# Patient Record
Sex: Male | Born: 1965 | Race: Black or African American | Hispanic: No | Smoking: Current every day smoker
Health system: Southern US, Community
[De-identification: ages and names within clinical notes are randomized; demographics above are authoritative.]

## PROBLEM LIST (undated history)

## (undated) DIAGNOSIS — R45851 Suicidal ideations: Secondary | ICD-10-CM

## (undated) DIAGNOSIS — I10 Essential (primary) hypertension: Secondary | ICD-10-CM

## (undated) DIAGNOSIS — F1911 Other psychoactive substance abuse, in remission: Secondary | ICD-10-CM

## (undated) DIAGNOSIS — F4310 Post-traumatic stress disorder, unspecified: Secondary | ICD-10-CM

## (undated) DIAGNOSIS — M549 Dorsalgia, unspecified: Secondary | ICD-10-CM

## (undated) DIAGNOSIS — R001 Bradycardia, unspecified: Secondary | ICD-10-CM

## (undated) DIAGNOSIS — R110 Nausea: Secondary | ICD-10-CM

## (undated) DIAGNOSIS — R51 Headache: Secondary | ICD-10-CM

---

## 2003-01-08 ENCOUNTER — Emergency Department (HOSPITAL_COMMUNITY): Admission: EM | Admit: 2003-01-08 | Discharge: 2003-01-08 | Payer: Self-pay | Admitting: Emergency Medicine

## 2003-01-12 ENCOUNTER — Emergency Department (HOSPITAL_COMMUNITY): Admission: EM | Admit: 2003-01-12 | Discharge: 2003-01-12 | Payer: Self-pay | Admitting: Emergency Medicine

## 2005-12-30 ENCOUNTER — Emergency Department (HOSPITAL_COMMUNITY): Admission: EM | Admit: 2005-12-30 | Discharge: 2005-12-30 | Payer: Self-pay | Admitting: Emergency Medicine

## 2011-10-13 ENCOUNTER — Encounter (HOSPITAL_COMMUNITY): Payer: Self-pay | Admitting: Emergency Medicine

## 2011-10-13 ENCOUNTER — Other Ambulatory Visit: Payer: Self-pay

## 2011-10-13 ENCOUNTER — Emergency Department (INDEPENDENT_AMBULATORY_CARE_PROVIDER_SITE_OTHER)
Admission: EM | Admit: 2011-10-13 | Discharge: 2011-10-13 | Disposition: A | Discharge status: Nursing Home (Includes ICF) | Payer: Self-pay | Source: Home / Self Care | Attending: Family Medicine | Admitting: Family Medicine

## 2011-10-13 ENCOUNTER — Emergency Department (HOSPITAL_COMMUNITY): Payer: Self-pay

## 2011-10-13 ENCOUNTER — Encounter: Payer: Self-pay | Admitting: Emergency Medicine

## 2011-10-13 ENCOUNTER — Inpatient Hospital Stay (HOSPITAL_COMMUNITY)
Admission: EM | Admit: 2011-10-13 | Discharge: 2011-10-14 | DRG: 305 | Disposition: A | Discharge status: Home / Self Care | Payer: Self-pay | Attending: Internal Medicine | Admitting: Internal Medicine

## 2011-10-13 DIAGNOSIS — Z8659 Personal history of other mental and behavioral disorders: Secondary | ICD-10-CM

## 2011-10-13 DIAGNOSIS — Z720 Tobacco use: Secondary | ICD-10-CM

## 2011-10-13 DIAGNOSIS — F17200 Nicotine dependence, unspecified, uncomplicated: Secondary | ICD-10-CM | POA: Diagnosis present

## 2011-10-13 DIAGNOSIS — I10 Essential (primary) hypertension: Secondary | ICD-10-CM

## 2011-10-13 DIAGNOSIS — Z79899 Other long term (current) drug therapy: Secondary | ICD-10-CM

## 2011-10-13 DIAGNOSIS — Z91199 Patient's noncompliance with other medical treatment and regimen due to unspecified reason: Secondary | ICD-10-CM

## 2011-10-13 DIAGNOSIS — Z9114 Patient's other noncompliance with medication regimen: Secondary | ICD-10-CM

## 2011-10-13 DIAGNOSIS — I169 Hypertensive crisis, unspecified: Secondary | ICD-10-CM

## 2011-10-13 DIAGNOSIS — Z9119 Patient's noncompliance with other medical treatment and regimen: Secondary | ICD-10-CM

## 2011-10-13 DIAGNOSIS — I498 Other specified cardiac arrhythmias: Secondary | ICD-10-CM | POA: Diagnosis present

## 2011-10-13 DIAGNOSIS — R51 Headache: Secondary | ICD-10-CM

## 2011-10-13 DIAGNOSIS — Z91148 Patient's other noncompliance with medication regimen for other reason: Secondary | ICD-10-CM

## 2011-10-13 DIAGNOSIS — M549 Dorsalgia, unspecified: Secondary | ICD-10-CM | POA: Diagnosis present

## 2011-10-13 DIAGNOSIS — R001 Bradycardia, unspecified: Secondary | ICD-10-CM

## 2011-10-13 HISTORY — DX: Nausea: R11.0

## 2011-10-13 HISTORY — DX: Bradycardia, unspecified: R00.1

## 2011-10-13 HISTORY — DX: Other psychoactive substance abuse, in remission: F19.11

## 2011-10-13 HISTORY — DX: Post-traumatic stress disorder, unspecified: F43.10

## 2011-10-13 HISTORY — DX: Suicidal ideations: R45.851

## 2011-10-13 HISTORY — DX: Dorsalgia, unspecified: M54.9

## 2011-10-13 HISTORY — PX: OTHER SURGICAL HISTORY: SHX169

## 2011-10-13 HISTORY — DX: Essential (primary) hypertension: I10

## 2011-10-13 HISTORY — DX: Headache: R51

## 2011-10-13 LAB — POCT I-STAT, CHEM 8
BUN: 9 mg/dL (ref 6–23)
Chloride: 103 mEq/L (ref 96–112)
Chloride: 103 mEq/L (ref 96–112)
Creatinine, Ser: 0.9 mg/dL (ref 0.50–1.35)
Glucose, Bld: 89 mg/dL (ref 70–99)
Glucose, Bld: 94 mg/dL (ref 70–99)
HCT: 42 % (ref 39.0–52.0)
Hemoglobin: 14.6 g/dL (ref 13.0–17.0)
Potassium: 3.4 mEq/L — ABNORMAL LOW (ref 3.5–5.1)
Potassium: 3.5 mEq/L (ref 3.5–5.1)
Sodium: 140 mEq/L (ref 135–145)

## 2011-10-13 LAB — DIFFERENTIAL
Eosinophils Relative: 3 % (ref 0–5)
Lymphocytes Relative: 58 % — ABNORMAL HIGH (ref 12–46)
Monocytes Absolute: 0.5 10*3/uL (ref 0.1–1.0)
Monocytes Relative: 9 % (ref 3–12)
Neutro Abs: 1.7 10*3/uL (ref 1.7–7.7)

## 2011-10-13 LAB — CBC
HCT: 39.5 % (ref 39.0–52.0)
Hemoglobin: 14 g/dL (ref 13.0–17.0)
MCHC: 35.4 g/dL (ref 30.0–36.0)
MCV: 92.9 fL (ref 78.0–100.0)
WBC: 5.7 10*3/uL (ref 4.0–10.5)

## 2011-10-13 MED ORDER — CLONIDINE HCL 0.1 MG PO TABS
0.1000 mg | ORAL_TABLET | Freq: Once | ORAL | Status: AC
  Administered 2011-10-13: 0.1 mg via ORAL

## 2011-10-13 MED ORDER — ACETAMINOPHEN 325 MG PO TABS
650.0000 mg | ORAL_TABLET | Freq: Once | ORAL | Status: AC
  Administered 2011-10-13: 650 mg via ORAL
  Filled 2011-10-13: qty 2

## 2011-10-13 MED ORDER — CLONIDINE HCL 0.1 MG PO TABS
ORAL_TABLET | ORAL | Status: AC
  Filled 2011-10-13: qty 1

## 2011-10-13 NOTE — ED Notes (Signed)
Pt comes in for rx refill of bp meds clonidine and norvasc.pt ran out of meds after taking morning dose.sx h/a reported.no blurred vision or dizziness.

## 2011-10-13 NOTE — ED Notes (Signed)
PT. REPORTS HEADACHE THIS AFTERNOON WITH SLIGHT NAUSEA DENIES INJURY, SENT FROM [facility] FOR FURTHER EVALUATION - RECEIVED CLONIDINE AT URGENT CARE.

## 2011-10-13 NOTE — ED Provider Notes (Signed)
History     CSN: [id number] Arrival date & time: 10/13/2011  5:49 PM   First MD Initiated Contact with Patient 10/13/11 1807      Chief Complaint  Patient presents with  . Medication Refill    (Consider location/radiation/quality/duration/timing/severity/associated sxs/prior treatment) HPI Comments: Pt was incarcerated and was released a few days ago. States he took his last dose of medication this morning. BP typically ran high, even while on medications while incarcerated. BP avg 160-180 systolic and 80s-90s diastolic. Has developed a mild HA since arrival to Urgent Care. No visual changes, dizziness or parasthesias. Denies chest discomfort, or dyspnea. Pt is requesting refill of his BP medications.   Patient is a 45 y.o. male presenting with hypertension and headaches.  Hypertension This is a chronic problem. The problem occurs constantly. The problem has not changed since onset.Associated symptoms include headaches. Pertinent negatives include no chest pain and no shortness of breath. The symptoms are aggravated by nothing. The symptoms are relieved by nothing.  Headache The primary symptoms include headaches. Primary symptoms do not include dizziness, visual change, paresthesias, fever, nausea or vomiting.  The headache began today. Headache is a new problem. The pain from the headache is at a severity of 7/10. Location/region(s) of the headache: frontal. The headache is associated with photophobia. The headache is not associated with aura, eye pain, visual change, neck stiffness, paresthesias, weakness or loss of balance.  Additional symptoms include photophobia. Additional symptoms do not include neck stiffness, weakness, loss of balance or aura. Medical issues also include hypertension.    Past Medical History  Diagnosis Date  . Hypertension     History reviewed. No pertinent past surgical history.  No family history on file.  History  Substance Use Topics  . Smoking  status: Current Everyday Smoker  . Smokeless tobacco: Not on file  . Alcohol Use: No      Review of Systems  Constitutional: Negative for fever and chills.  HENT: Negative for neck stiffness.   Eyes: Positive for photophobia. Negative for pain and visual disturbance.  Respiratory: Negative for chest tightness and shortness of breath.   Cardiovascular: Negative for chest pain and palpitations.  Gastrointestinal: Negative for nausea and vomiting.  Neurological: Positive for headaches. Negative for dizziness, weakness, light-headedness, numbness, paresthesias and loss of balance.    Allergies  Review of patient's allergies indicates no known allergies.  Home Medications   Current Outpatient Rx  Name Route Sig Dispense Refill  . AMLODIPINE BESYLATE 5 MG PO TABS Oral Take 5 mg by mouth daily.      Marland Kitchen CLONIDINE HCL 0.1 MG PO TABS Oral Take 0.1 mg by mouth 2 (two) times daily.      Marland Kitchen POLYETHYLENE GLYCOL 3350 PO PACK Oral Take 17 g by mouth daily.        BP 186/101  Pulse 61  Temp(Src) 97.5 F (36.4 C) (Oral)  Resp 18  SpO2 96%  Physical Exam  Constitutional: He appears well-developed and well-nourished. No distress.  HENT:  Head: Normocephalic and atraumatic.  Right Ear: Tympanic membrane, external ear and ear canal normal.  Left Ear: Tympanic membrane, external ear and ear canal normal.  Nose: Nose normal.  Mouth/Throat: Uvula is midline, oropharynx is clear and moist and mucous membranes are normal. No oropharyngeal exudate, posterior oropharyngeal edema or posterior oropharyngeal erythema.  Eyes: EOM are normal. Pupils are equal, round, and reactive to light.  Neck: Neck supple.  Cardiovascular: Normal rate, regular rhythm and normal heart sounds.  Pulmonary/Chest: Effort normal and breath sounds normal. No respiratory distress.  Lymphadenopathy:    He has no cervical adenopathy.  Neurological: He is alert.  Skin: Skin is warm and dry.  Psychiatric: He has a normal  mood and affect.    ED Course  Procedures (including critical care time)   Labs Reviewed  I-STAT, CHEM 8   No results found.   No diagnosis found.    MDM  BP remains elevated after Clonidine and no improvement of HA. Pt transferred to ED for further mgmt. Labs reviewed.       Melody Comas, [place] 10/13/11 2024
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
## 2011-10-13 NOTE — ED Provider Notes (Signed)
Medical screening examination/treatment/procedure(s) were performed by non-physician practitioner and as supervising physician I was immediately available for consultation/collaboration.  LANEY,RONNIE  Ronnie Laney, MD 10/13/11 2207 

## 2011-10-14 ENCOUNTER — Inpatient Hospital Stay (HOSPITAL_COMMUNITY): Payer: Self-pay

## 2011-10-14 ENCOUNTER — Encounter (HOSPITAL_COMMUNITY): Payer: Self-pay | Admitting: Family Medicine

## 2011-10-14 DIAGNOSIS — Z9114 Patient's other noncompliance with medication regimen: Secondary | ICD-10-CM

## 2011-10-14 DIAGNOSIS — Z720 Tobacco use: Secondary | ICD-10-CM

## 2011-10-14 DIAGNOSIS — I10 Essential (primary) hypertension: Secondary | ICD-10-CM | POA: Diagnosis present

## 2011-10-14 DIAGNOSIS — R001 Bradycardia, unspecified: Secondary | ICD-10-CM

## 2011-10-14 DIAGNOSIS — M549 Dorsalgia, unspecified: Secondary | ICD-10-CM | POA: Diagnosis present

## 2011-10-14 LAB — URINE DRUGS OF ABUSE SCREEN W ALC, ROUTINE (REF LAB)
Barbiturate Quant, Ur: NEGATIVE
Benzodiazepines.: NEGATIVE
Cocaine Metabolites: NEGATIVE
Creatinine,U: 184.6 mg/dL
Ethyl Alcohol: <10 mg/dL (ref <10)
Methadone: NEGATIVE
Opiate Screen, Urine: NEGATIVE
Phencyclidine (PCP): NEGATIVE
Propoxyphene: NEGATIVE

## 2011-10-14 MED ORDER — HYDROCHLOROTHIAZIDE 25 MG PO TABS: 25.0000 mg | ORAL_TABLET | Freq: Every day | ORAL | Status: DC

## 2011-10-14 MED ORDER — CLONIDINE HCL 0.1 MG PO TABS
0.1000 mg | ORAL_TABLET | Freq: Three times a day (TID) | ORAL | Status: DC
  Administered 2011-10-14 (×2): 0.1 mg via ORAL
  Filled 2011-10-14 (×5): qty 1

## 2011-10-14 MED ORDER — TRAMADOL HCL 50 MG PO TABS: 50.0000 mg | ORAL_TABLET | Freq: Three times a day (TID) | ORAL | Status: AC | PRN

## 2011-10-14 MED ORDER — VENLAFAXINE HCL ER 75 MG PO CP24
75.0000 mg | ORAL_CAPSULE | Freq: Every day | ORAL | Status: DC
  Administered 2011-10-14: 75 mg via ORAL
  Filled 2011-10-14: qty 1

## 2011-10-14 MED ORDER — CLONIDINE HCL 0.1 MG PO TABS: 0.1000 mg | ORAL_TABLET | Freq: Every day | ORAL | Status: DC

## 2011-10-14 MED ORDER — GADOBENATE DIMEGLUMINE 529 MG/ML IV SOLN
18.0000 mL | Freq: Once | INTRAVENOUS | Status: AC
  Administered 2011-10-14: 18 mL via INTRAVENOUS

## 2011-10-14 MED ORDER — POLYETHYLENE GLYCOL 3350 17 G PO PACK
17.0000 g | PACK | Freq: Every day | ORAL | Status: DC
  Administered 2011-10-14: 17 g via ORAL
  Filled 2011-10-14: qty 1

## 2011-10-14 MED ORDER — ACETAMINOPHEN 325 MG PO TABS: 650.0000 mg | ORAL_TABLET | Freq: Once | ORAL | Status: DC

## 2011-10-14 MED ORDER — HYDRALAZINE HCL 20 MG/ML IJ SOLN
20.0000 mg | INTRAMUSCULAR | Status: DC | PRN
  Filled 2011-10-14: qty 1

## 2011-10-14 MED ORDER — HYDROCHLOROTHIAZIDE 25 MG PO TABS
25.0000 mg | ORAL_TABLET | Freq: Every day | ORAL | Status: DC
  Filled 2011-10-14: qty 1

## 2011-10-14 MED ORDER — TRAZODONE HCL 100 MG PO TABS
100.0000 mg | ORAL_TABLET | Freq: Every day | ORAL | Status: DC
  Filled 2011-10-14: qty 1
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
## 2011-10-14 NOTE — Plan of Care (Signed)
Problem: Problem: Cardiovascular Progression Goal: HEMODYNAMIC STABILITY Outcome: Progressing hypertension
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
## 2011-10-14 NOTE — Progress Notes (Signed)
IV d/c'd. Tele d/c'd. Pt d/c'd to home.  Home meds and d/c instructions discussed with pt.  Pt denies any questions or concerns.  Pt leaving unit accompanied by staff and appears in no acute distress. Nino Glow RN

## 2011-10-14 NOTE — Discharge Summary (Signed)
Mark Lozano MRN: [id number] DOB/AGE: 1966-08-04 45 y.o.  Admit date: 10/13/2011 Discharge date: 10/14/2011  Primary Care Physician:  No primary provider on file.   Discharge Diagnoses:   No resolved problems to display.  Active Hospital Problems  Diagnoses Date Noted   . HTN (hypertension), malignant 10/14/2011   . Back pain 10/14/2011   . Bradycardia 10/14/2011   . Non compliance w medication regimen 10/14/2011   . Tobacco abuse 10/14/2011     Resolved Hospital Problems  Diagnoses Date Noted Date Resolved     DISCHARGE MEDICATION: Current Discharge Medication List    START taking these medications   Details  hydrochlorothiazide (HYDRODIURIL) 25 MG tablet Take 1 tablet (25 mg total) by mouth daily. Qty: 30 tablet, Refills: 0    traMADol (ULTRAM) 50 MG tablet Take 1 tablet (50 mg total) by mouth every 8 (eight) hours as needed for pain. Maximum dose= 8 tablets per day Qty: 30 tablet, Refills: 0      CONTINUE these medications which have CHANGED   Details  cloNIDine (CATAPRES) 0.1 MG tablet Take 1 tablet (0.1 mg total) by mouth daily. Qty: 60 tablet, Refills: 2      CONTINUE these medications which have NOT CHANGED   Details  amLODipine (NORVASC) 5 MG tablet Take 5 mg by mouth daily.     polyethylene glycol (MIRALAX / GLYCOLAX) packet Take 17 g by mouth daily.     traZODone (DESYREL) 100 MG tablet Take 100 mg by mouth at bedtime.      venlafaxine (EFFEXOR-XR) 75 MG 24 hr capsule Take 75 mg by mouth daily.             Consults:  Neurology   SIGNIFICANT DIAGNOSTIC STUDIES:  Ct Head Wo Contrast  10/13/2011  *RADIOLOGY REPORT*  Clinical Data: Headache and hypertension  CT HEAD WITHOUT CONTRAST  Technique:  Contiguous axial images were obtained from the base of the skull through the vertex without contrast.  Comparison: None.  Findings: There are extensive changes of white matter disease in the cerebrum bilaterally at the gray-white junction affecting the frontal  lobes, parietal lobes, and to a lesser degree, the occipital lobes.  There is no definite mass effect or acute hemorrhage.  No midline shift.  Basal ganglia are within normal limits.  Minimal chronic ischemic changes in the periventricular white matter.  IMPRESSION: Chronic ischemic changes are noted.  There is extensive white matter disease adjacent to the gray-white junction.  These findings may reflect ischemic changes of indeterminate age.  Also consider reversible encephalopathy, septic emboli, or multiple sclerosis.  Follow-up MRI may be helpful to further characterize.  Original Report Authenticated By: Donavan Burnet, M.D.   Mr Angiogram Head Wo Contrast  10/14/2011  *RADIOLOGY REPORT*  Clinical Data:  Chronic hypertension with acute exacerbation. Headache and blurred vision.  MRI HEAD WITHOUT AND WITH CONTRAST MRA HEAD WITHOUT CONTRAST  Technique:  Multiplanar, multiecho pulse sequences of the brain and surrounding structures were obtained without and with intravenous contrast.  Angiographic images of the head were obtained using MRA technique without contrast.  Contrast: 18mL MULTIHANCE GADOBENATE DIMEGLUMINE 529 MG/ML IV SOLN  Comparison:  CT head 10/13/2011  MRI HEAD WITHOUT AND WITH CONTRAST  Findings:  There is no evidence for acute infarction, intracranial hemorrhage, mass lesion, hydrocephalus, or extra-axial fluid. Extreme prominence of the perivascular spaces in the supratentorial white matter is an unusual manifestation of chronic hypertension. Increased signal in the periventricular and subcortical white matter is consistent with  chronic microvascular ischemic change. There is no significant atrophy.  There are no foci of chronic hemorrhage. There is no evidence for posterior reversible encephalopathy syndrome (PRES).  Post infusion, there is no abnormal enhancement of the brain or meninges. Pituitary and cerebellar tonsils are unremarkable.  There is no acute sinus or mastoid disease.   Orbital structures unremarkable.  IMPRESSION: No evidence for acute stroke, sequelae of malignant hypertension (PRES), or abnormal post contrast enhancement.  Hypodensities of the white matter identified on prior CT represents extreme prominence of the perivascular spaces, an unusual manifestation of chronic hypertension. There is no evidence to suggest acute/chronic infection, leukoencephalopathy, multiple lacunes, or demyelinating process.  Chronic microvascular ischemic change secondary hypertension is also identified in the periventricular and subcortical white matter.  MRA HEAD  Findings: The internal carotid arteries are widely patent through the upper cervical, skull base, cavernous, and supraclinoid segments.  Both middle cerebral arteries are widely patent.  Basilar artery is widely patent with the vertebrals codominant.  There is a 50% stenosis of the proximal right anterior cerebral artery.  The left anterior cerebral artery is mildly irregular but nonstenotic.  Both posterior cerebral arteries are widely patent. Moderate irregularity is seen in the proximal superior cerebellar arteries and anterior inferior cerebellar arteries. Both posterior inferior cerebellar arteries are widely patent.  IMPRESSION: No proximal carotid and basilar stenosis.  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  Original Report Authenticated By: Elsie Stain, M.D.   Mr Laqueta Jean Wo Contrast  10/14/2011  *RADIOLOGY REPORT*  Clinical Data:  Chronic hypertension with acute exacerbation. Headache and blurred vision.  MRI HEAD WITHOUT AND WITH CONTRAST MRA HEAD WITHOUT CONTRAST  Technique:  Multiplanar, multiecho pulse sequences of the brain and surrounding structures were obtained without and with intravenous contrast.  Angiographic images of the head were obtained using MRA technique without contrast.  Contrast: 18mL MULTIHANCE GADOBENATE DIMEGLUMINE 529 MG/ML IV SOLN  Comparison:  CT head 10/13/2011  MRI HEAD WITHOUT  AND WITH CONTRAST  Findings:  There is no evidence for acute infarction, intracranial hemorrhage, mass lesion, hydrocephalus, or extra-axial fluid. Extreme prominence of the perivascular spaces in the supratentorial white matter is an unusual manifestation of chronic hypertension. Increased signal in the periventricular and subcortical white matter is consistent with chronic microvascular ischemic change. There is no significant atrophy.  There are no foci of chronic hemorrhage. There is no evidence for posterior reversible encephalopathy syndrome (PRES).  Post infusion, there is no abnormal enhancement of the brain or meninges. Pituitary and cerebellar tonsils are unremarkable.  There is no acute sinus or mastoid disease.  Orbital structures unremarkable.  IMPRESSION: No evidence for acute stroke, sequelae of malignant hypertension (PRES), or abnormal post contrast enhancement.  Hypodensities of the white matter identified on prior CT represents extreme prominence of the perivascular spaces, an unusual manifestation of chronic hypertension. There is no evidence to suggest acute/chronic infection, leukoencephalopathy, multiple lacunes, or demyelinating process.  Chronic microvascular ischemic change secondary hypertension is also identified in the periventricular and subcortical white matter.  MRA HEAD  Findings: The internal carotid arteries are widely patent through the upper cervical, skull base, cavernous, and supraclinoid segments.  Both middle cerebral arteries are widely patent.  Basilar artery is widely patent with the vertebrals codominant.  There is a 50% stenosis of the proximal right anterior cerebral artery.  The left anterior cerebral artery is mildly irregular but nonstenotic.  Both posterior cerebral arteries are widely patent. Moderate irregularity is seen in the  proximal superior cerebellar arteries and anterior inferior cerebellar arteries. Both posterior inferior cerebellar arteries are widely  patent.  IMPRESSION: No proximal carotid and basilar stenosis.  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  Original Report Authenticated By: Elsie Stain, M.D.     BRIEF ADMITTING H & P:  HPI-Went to Urgent care this am as he was running out of BP meds. When his vitals were taken, the BP was very very high and was told to sit for a while and was given 1 dose of clonidine, and because of this, he was sen tto the ED.  Has a Headache-across his brow, and pain in the L Temp-parietal area-has had the headache sicne this pm. Just came on by iteslef. Some blurred vision as well, which is common for him with elevated blood pressure. Has double vision. Usually has no headaches--maybe 3-4 months ago sinc ehe has had a ha like this.  The last headache was alo assosc with elevated BP.  No weakness on any one side of the body.. has missed a few morning doses of clonidine and didn;t take it as it makes him drowsy--just takes the NOrvasc  No cp/sob/cough/cold/fever/someitmes dark stool/occ n, but no vomit. No rhino,   No resolved problems to display.  Active Hospital Problems  Diagnoses Date Noted   . HTN (hypertension), malignant 10/14/2011   . Back pain 10/14/2011   . Bradycardia 10/14/2011   . Non compliance w medication regimen 10/14/2011   . Tobacco abuse 10/14/2011     Resolved Hospital Problems  Diagnoses Date Noted Date Resolved     Disposition and Follow-up: [facility] He will need a Bmet to follow up K level.  Discharge Orders    Future Orders Please Complete By Expires   Diet - low sodium heart healthy      Increase activity slowly         Hospital Course:  Number #1 malignant hypertension secondary to medication noncompliance. Patient was admitted to telemetry, he was started on clonidine. Clonidine dose was adjusted due to bradycardia. He will be discharged on clonidine and hydrochlorothiazide. His systolic blood pressure has decreased to  150. He would need to follow with his primary care physician for further adjustment of medications. MRI only shows signs of chronic hypertension changes and review results with radiology. His headache has improved. No neuro exam non focal.  Number 2. Bradycardia. Clonidine was decreased to once a day. Patient was asymptomatic. The day of discharge patient was in improved condition, again headache has improved.   DISCHARGE EXAM:The physical exam is generally normal. Patient appears well, alert and oriented x 3, pleasant, cooperative. Vitals are as noted. Neck supple and free of adenopathy, or masses. No thyromegaly. PERLA. Ears, throat are normal. Lungs are clear to auscultation. Heart sounds are normal, no murmurs, clicks, gallops or rubs. Abdomen is soft, no tenderness, masses or organomegaly.  Breasts: {pe breast exam:[id number]: Extremities are normal. Peripheral pulses are normal. Screening neurological exam is normal without focal findings. Skin is normal without suspicious lesions noted.   Blood pressure 159/84, pulse 48, temperature 98.1 F (36.7 C), temperature source Oral, resp. rate 18, height 5\' 7"  (1.702 m), weight 79.9 kg (176 lb 2.4 oz), SpO2 98.00%.   Basename 10/13/11 2304 10/13/11 1944  NA 140 140  K 3.5 3.4*  CL 103 103  CO2 -- --  GLUCOSE 94 89  BUN 9 9  CREATININE 0.90 0.90  CALCIUM -- --  MG -- --  PHOS -- --    Basename 10/13/11 2324 10/13/11 2304  WBC 5.7 --  NEUTROABS 1.7 --  HGB 14.0 14.3  HCT 39.5 42.0  MCV 92.9 --  PLT 209 --    Signed: Cozy Veale 10/14/2011, 3:39 PM

## 2011-10-14 NOTE — Progress Notes (Addendum)
Clinical Social Work, 10/14/11, 1600:  Clinical Social Worker gave patient bus pass to return to [employer].  No further needs.Gloria Ricardo, [place], [place], [phone] See shadow chart for full assessment

## 2011-10-14 NOTE — Consult Note (Signed)
Tobacco cessation- Pt smokes 1ppd and is in contemplation stage. He verbalizes understanding about how it affects his BP and his health. However, he does not want to quit at this time. Refused education information.

## 2011-10-14 NOTE — ED Notes (Signed)
Report given to [facility]. Patient moved to room 30

## 2012-04-10 ENCOUNTER — Emergency Department (INDEPENDENT_AMBULATORY_CARE_PROVIDER_SITE_OTHER)
Admission: EM | Admit: 2012-04-10 | Discharge: 2012-04-10 | Disposition: A | Discharge status: Nursing Home (Includes ICF) | Payer: Self-pay | Source: Home / Self Care | Attending: Emergency Medicine | Admitting: Emergency Medicine

## 2012-04-10 ENCOUNTER — Inpatient Hospital Stay (HOSPITAL_COMMUNITY)
Admission: EM | Admit: 2012-04-10 | Discharge: 2012-04-12 | DRG: 305 | Disposition: A | Discharge status: Home / Self Care | Payer: Self-pay | Attending: Internal Medicine | Admitting: Internal Medicine

## 2012-04-10 ENCOUNTER — Encounter (HOSPITAL_COMMUNITY): Payer: Self-pay | Admitting: Emergency Medicine

## 2012-04-10 ENCOUNTER — Emergency Department (HOSPITAL_COMMUNITY): Payer: Self-pay

## 2012-04-10 ENCOUNTER — Encounter (HOSPITAL_COMMUNITY): Payer: Self-pay | Admitting: Physical Medicine and Rehabilitation

## 2012-04-10 DIAGNOSIS — R079 Chest pain, unspecified: Secondary | ICD-10-CM | POA: Diagnosis present

## 2012-04-10 DIAGNOSIS — Z79899 Other long term (current) drug therapy: Secondary | ICD-10-CM

## 2012-04-10 DIAGNOSIS — I259 Chronic ischemic heart disease, unspecified: Secondary | ICD-10-CM

## 2012-04-10 DIAGNOSIS — I10 Essential (primary) hypertension: Principal | ICD-10-CM | POA: Diagnosis present

## 2012-04-10 DIAGNOSIS — Z91199 Patient's noncompliance with other medical treatment and regimen due to unspecified reason: Secondary | ICD-10-CM

## 2012-04-10 DIAGNOSIS — M549 Dorsalgia, unspecified: Secondary | ICD-10-CM

## 2012-04-10 DIAGNOSIS — Z9114 Patient's other noncompliance with medication regimen: Secondary | ICD-10-CM

## 2012-04-10 DIAGNOSIS — R51 Headache: Secondary | ICD-10-CM

## 2012-04-10 DIAGNOSIS — F329 Major depressive disorder, single episode, unspecified: Secondary | ICD-10-CM

## 2012-04-10 DIAGNOSIS — I498 Other specified cardiac arrhythmias: Secondary | ICD-10-CM | POA: Diagnosis present

## 2012-04-10 DIAGNOSIS — R0789 Other chest pain: Secondary | ICD-10-CM | POA: Diagnosis present

## 2012-04-10 DIAGNOSIS — Z9119 Patient's noncompliance with other medical treatment and regimen: Secondary | ICD-10-CM

## 2012-04-10 DIAGNOSIS — R001 Bradycardia, unspecified: Secondary | ICD-10-CM

## 2012-04-10 DIAGNOSIS — Z720 Tobacco use: Secondary | ICD-10-CM

## 2012-04-10 DIAGNOSIS — F4310 Post-traumatic stress disorder, unspecified: Secondary | ICD-10-CM | POA: Diagnosis present

## 2012-04-10 DIAGNOSIS — I2589 Other forms of chronic ischemic heart disease: Secondary | ICD-10-CM

## 2012-04-10 DIAGNOSIS — F3289 Other specified depressive episodes: Secondary | ICD-10-CM | POA: Diagnosis present

## 2012-04-10 DIAGNOSIS — G43909 Migraine, unspecified, not intractable, without status migrainosus: Secondary | ICD-10-CM

## 2012-04-10 LAB — CARDIAC PANEL(CRET KIN+CKTOT+MB+TROPI)
CK, MB: 4.5 ng/mL — ABNORMAL HIGH (ref 0.3–4.0)
Troponin I: <0.3 ng/mL (ref <0.30)

## 2012-04-10 LAB — POCT I-STAT, CHEM 8
BUN: 14 mg/dL (ref 6–23)
Creatinine, Ser: 1 mg/dL (ref 0.50–1.35)
Glucose, Bld: 128 mg/dL — ABNORMAL HIGH (ref 70–99)
Hemoglobin: 15.6 g/dL (ref 13.0–17.0)
Sodium: 142 mEq/L (ref 135–145)
TCO2: 27 mmol/L (ref 0–100)

## 2012-04-10 MED ORDER — SODIUM CHLORIDE 0.9 % IJ SOLN
3.0000 mL | Freq: Two times a day (BID) | INTRAMUSCULAR | Status: DC
  Administered 2012-04-11: 3 mL via INTRAVENOUS

## 2012-04-10 MED ORDER — POLYETHYLENE GLYCOL 3350 17 G PO PACK
17.0000 g | PACK | Freq: Every day | ORAL | Status: DC
  Administered 2012-04-11 – 2012-04-12 (×2): 17 g via ORAL
  Filled 2012-04-10 (×2): qty 1

## 2012-04-10 MED ORDER — CLONIDINE HCL 0.1 MG PO TABS
0.2000 mg | ORAL_TABLET | Freq: Once | ORAL | Status: AC
  Administered 2012-04-10: 0.2 mg via ORAL

## 2012-04-10 MED ORDER — HYDRALAZINE HCL 20 MG/ML IJ SOLN
5.0000 mg | Freq: Once | INTRAMUSCULAR | Status: AC
  Administered 2012-04-10: 5 mg via INTRAVENOUS
  Filled 2012-04-10: qty 0.25

## 2012-04-10 MED ORDER — SODIUM CHLORIDE 0.9 % IJ SOLN
3.0000 mL | Freq: Two times a day (BID) | INTRAMUSCULAR | Status: DC
  Administered 2012-04-11 (×2): 3 mL via INTRAVENOUS

## 2012-04-10 MED ORDER — CLONIDINE HCL 0.1 MG PO TABS
ORAL_TABLET | ORAL | Status: AC
  Filled 2012-04-10: qty 2

## 2012-04-10 MED ORDER — NITROGLYCERIN 0.4 MG SL SUBL: 0.4000 mg | SUBLINGUAL_TABLET | SUBLINGUAL | Status: DC | PRN

## 2012-04-10 MED ORDER — SODIUM CHLORIDE 0.9 % IV SOLN
INTRAVENOUS | Status: DC
  Administered 2012-04-10: 17:00:00 via INTRAVENOUS

## 2012-04-10 MED ORDER — ONDANSETRON HCL 4 MG PO TABS: 4.0000 mg | ORAL_TABLET | Freq: Four times a day (QID) | ORAL | Status: DC | PRN

## 2012-04-10 MED ORDER — HYDROCHLOROTHIAZIDE 25 MG PO TABS
25.0000 mg | ORAL_TABLET | Freq: Every day | ORAL | Status: DC
  Filled 2012-04-10: qty 1

## 2012-04-10 MED ORDER — ACETAMINOPHEN 325 MG PO TABS: 650.0000 mg | ORAL_TABLET | Freq: Four times a day (QID) | ORAL | Status: DC | PRN

## 2012-04-10 MED ORDER — FUROSEMIDE 40 MG PO TABS
40.0000 mg | ORAL_TABLET | Freq: Once | ORAL | Status: AC
  Administered 2012-04-10: 40 mg via ORAL

## 2012-04-10 MED ORDER — FUROSEMIDE 10 MG/ML IJ SOLN: 40.0000 mg | Freq: Once | INTRAMUSCULAR | Status: DC

## 2012-04-10 MED ORDER — ONDANSETRON HCL 4 MG/2ML IJ SOLN: 4.0000 mg | Freq: Four times a day (QID) | INTRAMUSCULAR | Status: DC | PRN

## 2012-04-10 MED ORDER — HYDRALAZINE HCL 25 MG PO TABS
25.0000 mg | ORAL_TABLET | Freq: Once | ORAL | Status: AC
  Administered 2012-04-10: 25 mg via ORAL
  Filled 2012-04-10: qty 1

## 2012-04-10 MED ORDER — HYDROCHLOROTHIAZIDE 25 MG PO TABS
25.0000 mg | ORAL_TABLET | ORAL | Status: AC
  Administered 2012-04-10: 25 mg via ORAL
  Filled 2012-04-10: qty 1

## 2012-04-10 MED ORDER — HYDRALAZINE HCL 25 MG PO TABS
25.0000 mg | ORAL_TABLET | Freq: Three times a day (TID) | ORAL | Status: DC
  Administered 2012-04-11 – 2012-04-12 (×5): 25 mg via ORAL
  Filled 2012-04-10 (×8): qty 1

## 2012-04-10 MED ORDER — HYDRALAZINE HCL 20 MG/ML IJ SOLN
5.0000 mg | Freq: Once | INTRAMUSCULAR | Status: DC
  Filled 2012-04-10: qty 0.25

## 2012-04-10 MED ORDER — HALOPERIDOL 1 MG PO TABS
1.0000 mg | ORAL_TABLET | Freq: Two times a day (BID) | ORAL | Status: DC
  Administered 2012-04-10 – 2012-04-12 (×4): 1 mg via ORAL
  Filled 2012-04-10 (×5): qty 1

## 2012-04-10 MED ORDER — ASPIRIN EC 325 MG PO TBEC
325.0000 mg | DELAYED_RELEASE_TABLET | Freq: Every day | ORAL | Status: DC
  Administered 2012-04-11 – 2012-04-12 (×2): 325 mg via ORAL
  Filled 2012-04-10 (×2): qty 1

## 2012-04-10 MED ORDER — CLONIDINE HCL 0.1 MG PO TABS
ORAL_TABLET | ORAL | Status: AC
  Filled 2012-04-10: qty 1

## 2012-04-10 MED ORDER — VENLAFAXINE HCL ER 75 MG PO CP24
75.0000 mg | ORAL_CAPSULE | Freq: Every day | ORAL | Status: DC
  Administered 2012-04-11 – 2012-04-12 (×2): 75 mg via ORAL
  Filled 2012-04-10 (×2): qty 1

## 2012-04-10 MED ORDER — TRAZODONE HCL 100 MG PO TABS
100.0000 mg | ORAL_TABLET | Freq: Every day | ORAL | Status: DC
  Administered 2012-04-10 – 2012-04-11 (×2): 100 mg via ORAL
  Filled 2012-04-10 (×3): qty 1

## 2012-04-10 MED ORDER — FUROSEMIDE 40 MG PO TABS
ORAL_TABLET | ORAL | Status: AC
  Filled 2012-04-10: qty 1

## 2012-04-10 MED ORDER — HYDROCHLOROTHIAZIDE 25 MG PO TABS
25.0000 mg | ORAL_TABLET | Freq: Every day | ORAL | Status: DC
  Administered 2012-04-11 – 2012-04-12 (×2): 25 mg via ORAL
  Filled 2012-04-10 (×2): qty 1

## 2012-04-10 MED ORDER — ACETAMINOPHEN 650 MG RE SUPP: 650.0000 mg | Freq: Four times a day (QID) | RECTAL | Status: DC | PRN

## 2012-04-10 MED ORDER — ACETAMINOPHEN 500 MG PO TABS
1000.0000 mg | ORAL_TABLET | Freq: Once | ORAL | Status: AC
  Administered 2012-04-10: 1000 mg via ORAL
  Filled 2012-04-10: qty 2

## 2012-04-10 NOTE — ED Provider Notes (Signed)
Chief Complaint  Patient presents with  . Hypertension    History of Present Illness:   Mark Lozano is a 46 year old male with a history of high blood pressure for many years. He was sent here today by his psychiatrist at the [facility] because of elevated blood pressure there, although he doesn't know what his reading was. He was seen here in November and ultimately hospitalized for 2-3 days because of extremely elevated blood pressure. He was sent home on hydrochlorothiazide, clonidine, and pain meds for his headaches. [facility] [place] [facility] [place] [facility] [place] after leaving here, he was treated there with several medications, he does not know the name. He recently got out of jail a week ago and is staying at [facility] house right now. He's had a headache since yesterday, this is a throbbing, pounding headache associated with nausea, visual blurring, photophobia, and phonophobia. He also notes an episode of tightness in his chest that occurred 2 days ago. This lasted several minutes and was unrelated to exertion. There was no radiation. He felt short of breath with it. He denies any nausea or diaphoresis. He doesn't have much of an appetite and he feels chills. He has no prior history of cardiac disease.  Review of Systems:  Other than noted above, the patient denies any of the following symptoms. Systemic:  No fever, chills, sweats, or fatigue. ENT:  No nasal congestion, rhinorrhea, or sore throat. Pulmonary:  No cough, wheezing, shortness of breath, sputum production, hemoptysis. Cardiac:  No palpitations, rapid heartbeat, dizziness, presyncope or syncope. GI:  No abdominal pain, heartburn, nausea, or vomiting. Skin:  No rash or itching. Ext:  No leg pain or swelling.   PMFSH:  Past medical history, family history, social history, meds, and allergies were reviewed and updated as needed.  Physical Exam:   Vital signs:  BP 169/103  Pulse 74  Temp(Src) 98.7 F (37.1 C) (Oral)  Resp  20  SpO2 100% Gen:  Alert, oriented, in no distress, skin warm and dry. Eye:  PERRL, lids and conjunctivas normal.  Sclera non-icteric. ENT:  Mucous membranes moist, pharynx clear. Neck:  Supple, no adenopathy or tenderness.  No JVD. Lungs:  Clear to auscultation, no wheezes, rales or rhonchi.  No respiratory distress. Heart:  Regular rhythm.  No gallops, murmers, clicks or rubs. Chest:  No chest wall tenderness. Abdomen:  Soft, nontender, no organomegaly or mass.  Bowel sounds normal.  No pulsatile abdominal mass or bruit. Ext:  No edema.  No calf tenderness and Homann's sign negative.  Pulses full and equal. Skin:  Warm and dry.  No rash.  Labs:   Results for orders placed during the hospital encounter of 04/10/12  POCT I-STAT, CHEM 8      Component Value Range   Sodium 142  135 - 145 (mEq/L)   Potassium 3.8  3.5 - 5.1 (mEq/L)   Chloride 103  96 - 112 (mEq/L)   BUN 14  6 - 23 (mg/dL)   Creatinine, Ser 6.04  0.50 - 1.35 (mg/dL)   Glucose, Bld 540 (*) 70 - 99 (mg/dL)   Calcium, Ion 9.81  1.91 - 1.32 (mmol/L)   TCO2 27  0 - 100 (mmol/L)   Hemoglobin 15.6  13.0 - 17.0 (g/dL)   HCT 47.8  29.5 - 62.1 (%)     Radiology:  No results found.  EKG:   Date: 04/10/2012  Rate: 66  Rhythm: normal sinus rhythm  QRS Axis: normal  Intervals: normal  ST/T Wave abnormalities: nonspecific T wave changes  Conduction Disutrbances:none  Narrative Interpretation: Normal sinus rhythm with T-wave inversions in leads 3 and aVF. These were not present on a previous EKG of    10/13/2011. Old EKG Reviewed: changes noted as above.    Medications given in [facility]:   He was given clonidine 0.2 mg by mouth and furosemide 40 mg by mouth because of his elevated blood pressure. He tolerated these well without any immediate side effects. An IV was started with normal saline at 50 mL per hour. He'll be transferred to the emergency department via shuttle.  Assessment:  The primary encounter diagnosis was  Uncontrolled hypertension. Diagnoses of Myocardial ischemia and Migraine headache were also pertinent to this visit.   Plan:   1.  The following meds were prescribed:   New Prescriptions   No medications on file   2.  The patient was transferred to the emergency department via shuttle.  Mark Likes, MD 04/10/12 [phone]

## 2012-04-10 NOTE — ED Notes (Signed)
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 

## 2012-04-10 NOTE — ED Notes (Signed)
Notified [facility] of transport 

## 2012-04-10 NOTE — ED Notes (Signed)
Patient states he went to see psychiatrist and noted increased blood pressure. Patient then went to [facility] for recheck and still had high blood pressure. States has history of HTN and hasn't been taken his meds.

## 2012-04-10 NOTE — ED Notes (Signed)
Pt on heart monitor and 02 with a nasal canula at 2lpm
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
## 2012-04-10 NOTE — Discharge Instructions (Signed)
Go to www.goodrx.com to look up your medications. This will give you a list of where you can find your prescriptions at the most affordable prices.   RESOURCE GUIDE  Chronic Pain Problems Contact [facility]  [phone] Patients need to be referred by their primary care doctor.  Insufficient Money for Medicine Contact [employer]:  call "211" or [facility] [phone].  No Primary Care Doctor Call [facility]  [phone] Other agencies that provide inexpensive medical care    [facility]  [phone]    [facility]  [phone]    [facility]  [phone]    [facility]  [phone] [address] [place] [address]    [facility]  [phone]    [facility]  [phone] [facility] [phone]   [address] [place]. [address] [place], [place] [address]  Dr. Oneal Grout [phone]  [address] [facility] [place] [address] Medicare only  Psychological Services [facility]  [phone] [facility]  [phone] [facility]   [phone] (emergency services [phone])  Substance Abuse Resources [facility]  [phone] [facility] [phone] [facility] [phone] Floydene Flock [phone] Residential & Outpatient Substance Abuse Program  [phone]  Abuse/Neglect [facility] Child Abuse Hotline [phone] [facility] Child Abuse Hotline [phone] (After Hours)  Emergency Shelter [facility] [phone]  M[facility] [place] [facility] [phone] [facility] [phone]  MRSA Hotline #:   650-768-2482                                                Cristobal Goldmann Phone:  [phone]                                   Phone:  [phone]                 Phone:   [phone]  [facility] Mental [facility] Phone:  [phone]  [facility] Child Abuse Hotline [phone]  [facility] Resources  [facility]     [employer]                          [facility] Dept. [address] [place]                       [address]      [address] [place] [address]   [phone] (After Hours)    We have determined that your problem requires further evaluation in the emergency department.  We will take care of your transport there.  Once at the emergency department, you will be evaluated by a provider and they will order whatever treatment or tests they deem necessary.  We cannot guarantee that they will do any specific test or do any specific treatment.

## 2012-04-10 NOTE — ED Notes (Addendum)
Reports being at [facility], staff told patient bp was high and instructed to come to ucc. C/o headache and nausea, blurry vision.  History of the same feeling when blood pressure runs too high.  Reports being incarcerated and released last week.  Reports not having medicines listed in chart while incarcerated ( November until last week) reports finding his clonidine and taking this today.  [facility] is providing refills of psych medicines, not blood pressure medicine

## 2012-04-10 NOTE — ED Notes (Signed)
Monitor and o2 placed

## 2012-04-10 NOTE — ED Notes (Signed)
Report to sabrina, rn.
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
## 2012-04-11 DIAGNOSIS — R079 Chest pain, unspecified: Secondary | ICD-10-CM

## 2012-04-11 DIAGNOSIS — I10 Essential (primary) hypertension: Secondary | ICD-10-CM

## 2012-04-11 DIAGNOSIS — F329 Major depressive disorder, single episode, unspecified: Secondary | ICD-10-CM

## 2012-04-11 LAB — COMPREHENSIVE METABOLIC PANEL
ALT: 33 U/L (ref 0–53)
AST: 30 U/L (ref 0–37)
Albumin: 3.5 g/dL (ref 3.5–5.2)
Alkaline Phosphatase: 83 U/L (ref 39–117)
CO2: 27 mEq/L (ref 19–32)
Chloride: 101 mEq/L (ref 96–112)
GFR calc non Af Amer: >90 mL/min (ref >90)
Potassium: 3.1 mEq/L — ABNORMAL LOW (ref 3.5–5.1)
Sodium: 139 mEq/L (ref 135–145)
Total Bilirubin: 0.7 mg/dL (ref 0.3–1.2)

## 2012-04-11 LAB — CARDIAC PANEL(CRET KIN+CKTOT+MB+TROPI)
CK, MB: 4 ng/mL (ref 0.3–4.0)
CK, MB: 3.6 ng/mL (ref 0.3–4.0)
Relative Index: 2.5 (ref 0.0–2.5)
Total CK: 152 U/L (ref 7–232)
Total CK: 142 U/L (ref 7–232)
Troponin I: <0.3 ng/mL (ref <0.30)

## 2012-04-11 LAB — RAPID URINE DRUG SCREEN, HOSP PERFORMED
Barbiturates: NOT DETECTED
Tetrahydrocannabinol: NOT DETECTED

## 2012-04-11 LAB — GLUCOSE, CAPILLARY: Glucose-Capillary: 111 mg/dL — ABNORMAL HIGH (ref 70–99)

## 2012-04-11 LAB — CBC
HCT: 38.7 % — ABNORMAL LOW (ref 39.0–52.0)
Hemoglobin: 13.8 g/dL (ref 13.0–17.0)
MCHC: 35.7 g/dL (ref 30.0–36.0)

## 2012-04-11 MED ORDER — POTASSIUM CHLORIDE CRYS ER 20 MEQ PO TBCR
40.0000 meq | EXTENDED_RELEASE_TABLET | Freq: Two times a day (BID) | ORAL | Status: AC
  Administered 2012-04-11 – 2012-04-12 (×2): 40 meq via ORAL
  Filled 2012-04-11 (×2): qty 2

## 2012-04-11 NOTE — Progress Notes (Signed)
  Echocardiogram 2D Echocardiogram has been performed.  Cathie Beams Deneen 04/11/2012, 10:54 AM

## 2012-04-11 NOTE — Progress Notes (Deleted)
HR in 30's non-sustained with 2.17sec pause. Resting in recliner chair. Denies chest pain or SOB. VS- 50-18-180/68. O2 sat. 96% on RA. MD notified. Orders received. Will continue to monitor.

## 2012-04-11 NOTE — ED Provider Notes (Signed)
I saw and evaluated the patient, reviewed the resident's note and I agree with the findings and plan and agree with their ECG interpretation. Patient sent in from urgent care with headache and hypertension. Blood pressure is elevated here. He's been off his medications. He has some new T wave changes on his EKG. He'll be admitted to medicine.  Juliet Rude. Rubin Payor, MD 04/11/12 [phone]

## 2012-04-11 NOTE — Progress Notes (Signed)
04/11/2012 Cory Kitt SPARKS Case Management Note [phone]  Utilization review completed.  

## 2012-04-11 NOTE — Progress Notes (Signed)
[facility] PROGRESS NOTE  Mark Lozano QMV:[id number] DOB: October 04, 1966 DOA: 04/10/2012   Assessment/Plan: Patient Active Hospital Problem List: Hypertension, accelerated (04/10/2012) -Improved with Hydralazine. At home he is on clonidine.  Bradycardia (10/14/2011) -resolved, probably reflex  Chest pain (04/10/2012) -Cardiac enzymes negative.  -EKG with new T wave inversion in the inferiori leads.  Code Status: full code Family Communication: Friend [phone] Disposition Plan: home  Lambert Keto, MD  [facility] Pager [phone]  If 7PM-7AM, please contact night-coverage [email or web address] Password [email or web address] 04/11/2012, 9:56 AM   LOS: 1 day   Procedures:  none  Antibiotics:  none  Interim History: 46 year old male with PMH of HTN and noncomplaincefound to be having high blood pressure and was referred to the urgent care at [facility]. In addition patient also stated he had chest pain 2 days ago which lasted for 2 minutes. cardiac enzymes did not show anything acute MI. EKG shows new T-wave changes in the lateral leads and inferior leads   Subjective: Chest pain free.  Objective: Filed Vitals:   04/10/12 2224 04/11/12 0157 04/11/12 0548 04/11/12 0643  BP: 164/83 161/89 159/114 122/84  Pulse: 58 71 71   Temp: 97.4 F (36.3 C) 97.4 F (36.3 C) 97.9 F (36.6 C)   TempSrc: Oral     Resp: 16 16 16    Height: 5\' 7"  (1.702 m)     Weight: 82.4 kg (181 lb 10.5 oz)  82.4 kg (181 lb 10.5 oz)   SpO2: 100% 100% 99%     Intake/Output Summary (Last 24 hours) at 04/11/12 0956 Last data filed at 04/11/12 0953  Gross per 24 hour  Intake    243 ml  Output    875 ml  Net   -632 ml   Weight change:   Exam:  General: Alert, awake, oriented x3, in no acute distress.  HEENT: No bruits, no goiter.  Heart: Regular rate and rhythm, without murmurs, rubs, gallops.  Lungs: Good air movement, bilateral air movement.  Abdomen: Soft, nontender, nondistended,  positive bowel sounds.  Neuro: Grossly intact, nonfocal.   Data Reviewed: Basic Metabolic Panel:  Lab 04/11/12 6440 04/10/12 1610  NA 139 142  K 3.1* 3.8  CL 101 103  CO2 27 --  GLUCOSE 107* 128*  BUN 9 14  CREATININE 0.75 1.00  CALCIUM 9.4 --  MG -- --  PHOS -- --   Liver Function Tests:  Lab 04/11/12 0624  AST 30  ALT 33  ALKPHOS 83  BILITOT 0.7  PROT 6.9  ALBUMIN 3.5   No results found for this basename: LIPASE:5,AMYLASE:5 in the last 168 hours No results found for this basename: AMMONIA:5 in the last 168 hours CBC:  Lab 04/11/12 0624 04/10/12 1610  WBC 5.4 --  NEUTROABS -- --  HGB 13.8 15.6  HCT 38.7* 46.0  MCV 88.6 --  PLT 231 --   Cardiac Enzymes:  Lab 04/11/12 0624 04/10/12 2244  CKTOTAL 152 198  CKMB 4.0 4.5*  CKMBINDEX -- --  TROPONINI <0.30 <0.30   BNP: No components found with this basename: POCBNP:5 CBG:  Lab 04/11/12 0651  GLUCAP 111*    No results found for this or any previous visit (from the past 240 hour(s)).   Studies: Dg Chest 2 View  04/10/2012  *RADIOLOGY REPORT*  Clinical Data: Headache for 2 days. No chest pain or shortness of breath.  History of bradycardia, hypertension, tobacco abuse.  CHEST - 2 VIEW  Comparison: None.  Findings:  The heart size and mediastinal contours are within normal limits.  Both lungs are clear.  The visualized skeletal structures are unremarkable. Surgical clips left neck, uncertain significance.  IMPRESSION: No active cardiopulmonary disease.  Original Report Authenticated By: Elsie Stain, M.D.    Scheduled Meds:   . acetaminophen  1,000 mg Oral Once  . aspirin EC  325 mg Oral Daily  . haloperidol  1 mg Oral BID  . hydrALAZINE  5 mg Intravenous Once  . hydrALAZINE  5 mg Intravenous Once  . hydrALAZINE  25 mg Oral Once  . hydrALAZINE  25 mg Oral Q8H  . hydrochlorothiazide  25 mg Oral To Major  . hydrochlorothiazide  25 mg Oral Daily  . polyethylene glycol  17 g Oral Daily  . sodium chloride   3 mL Intravenous Q12H  . sodium chloride  3 mL Intravenous Q12H  . traZODone  100 mg Oral QHS  . venlafaxine XR  75 mg Oral Daily  . DISCONTD: hydrochlorothiazide  25 mg Oral Daily   Continuous Infusions:

## 2012-04-12 DIAGNOSIS — F3289 Other specified depressive episodes: Secondary | ICD-10-CM

## 2012-04-12 DIAGNOSIS — I10 Essential (primary) hypertension: Secondary | ICD-10-CM

## 2012-04-12 DIAGNOSIS — R079 Chest pain, unspecified: Secondary | ICD-10-CM

## 2012-04-12 DIAGNOSIS — F329 Major depressive disorder, single episode, unspecified: Secondary | ICD-10-CM

## 2012-04-12 MED ORDER — HYDROCHLOROTHIAZIDE 25 MG PO TABS: 25.0000 mg | ORAL_TABLET | Freq: Every day | ORAL | Status: DC

## 2012-04-12 MED ORDER — CLONIDINE HCL 0.1 MG PO TABS: 0.1000 mg | ORAL_TABLET | Freq: Every day | ORAL | Status: DC

## 2012-04-12 MED ORDER — HYDRALAZINE HCL 25 MG PO TABS: 25.0000 mg | ORAL_TABLET | Freq: Three times a day (TID) | ORAL | Status: DC

## 2012-04-12 NOTE — Progress Notes (Signed)
[facility] PROGRESS NOTE  Mark Lozano [id number] DOB: 1966/06/15 DOA: 04/10/2012   Assessment/Plan: Patient Active Hospital Problem List: Hypertension, accelerated (04/10/2012) -Improved with Hydralazine. At home he is on clonidine.  Bradycardia (10/14/2011) -resolved, probably reflex  Chest pain (04/10/2012) -Cardiac enzymes negative.  -EKG with new T wave inversion in the inferiori leads. -most likely 2/2 to extremely high BP.- -echo mild diastolic dysfunction  Code Status: full code Family Communication: Friend [phone] Disposition Plan: home  Lambert Keto, MD  [facility] Pager [phone]  If 7PM-7AM, please contact night-coverage [email or web address] Password [email or web address] 04/12/2012, 7:40 AM   LOS: 2 days   Procedures:  none  Antibiotics:  none  Interim History: 46 year old male with PMH of HTN and noncomplaincefound to be having high blood pressure and was referred to the urgent care at [facility]. In addition patient also stated he had chest pain 2 days ago which lasted for 2 minutes. cardiac enzymes did not show anything acute MI. EKG shows new T-wave changes in the lateral leads and inferior leads   Subjective: Chest pain free.  Objective: Filed Vitals:   04/11/12 1000 04/11/12 1345 04/11/12 2100 04/12/12 0508  BP: 148/82 152/89 142/96 118/77  Pulse:  69 62 66  Temp:  97.8 F (36.6 C) 97.5 F (36.4 C) 97 F (36.1 C)  TempSrc:  Oral Oral Oral  Resp:  18 18 18   Height:      Weight:    80.65 kg (177 lb 12.8 oz)  SpO2:  100% 99% 96%    Intake/Output Summary (Last 24 hours) at 04/12/12 0740 Last data filed at 04/12/12 0100  Gross per 24 hour  Intake    603 ml  Output    900 ml  Net   -297 ml   Weight change: -1.75 kg (-3 lb 13.7 oz)  Exam:  General: Alert, awake, oriented x3, in no acute distress.  HEENT: No bruits, no goiter.  Heart: Regular rate and rhythm, without murmurs, rubs, gallops.  Lungs: Good air movement,  bilateral air movement.  Abdomen: Soft, nontender, nondistended, positive bowel sounds.  Neuro: Grossly intact, nonfocal.   Data Reviewed: Basic Metabolic Panel:  Lab 04/11/12 8416 04/10/12 1610  NA 139 142  K 3.1* 3.8  CL 101 103  CO2 27 --  GLUCOSE 107* 128*  BUN 9 14  CREATININE 0.75 1.00  CALCIUM 9.4 --  MG -- --  PHOS -- --   Liver Function Tests:  Lab 04/11/12 0624  AST 30  ALT 33  ALKPHOS 83  BILITOT 0.7  PROT 6.9  ALBUMIN 3.5   No results found for this basename: LIPASE:5,AMYLASE:5 in the last 168 hours No results found for this basename: AMMONIA:5 in the last 168 hours CBC:  Lab 04/11/12 0624 04/10/12 1610  WBC 5.4 --  NEUTROABS -- --  HGB 13.8 15.6  HCT 38.7* 46.0  MCV 88.6 --  PLT 231 --   Cardiac Enzymes:  Lab 04/11/12 1428 04/11/12 0624 04/10/12 2244  CKTOTAL 142 152 198  CKMB 3.6 4.0 4.5*  CKMBINDEX -- -- --  TROPONINI <0.30 <0.30 <0.30   BNP: No components found with this basename: POCBNP:5 CBG:  Lab 04/11/12 0651  GLUCAP 111*    No results found for this or any previous visit (from the past 240 hour(s)).   Studies: Dg Chest 2 View  04/10/2012  *RADIOLOGY REPORT*  Clinical Data: Headache for 2 days. No chest pain or shortness of breath.  History of bradycardia,  hypertension, tobacco abuse.  CHEST - 2 VIEW  Comparison: None.  Findings:  The heart size and mediastinal contours are within normal limits.  Both lungs are clear.  The visualized skeletal structures are unremarkable. Surgical clips left neck, uncertain significance.  IMPRESSION: No active cardiopulmonary disease.  Original Report Authenticated By: Elsie Stain, M.D.   ECHO: Left ventricle: There was moderate concentric hypertrophy. Systolic function was hyperdynamic. The estimated ejection fraction was in the range of 70% to 80%. There was no dynamic obstruction. Wall motion was normal; there were no egional wall motion abnormalities. Doppler parameters are  consistent with  abnormal left ventricular relaxation (grade 1 diastolic dysfunction).   Scheduled Meds:    . aspirin EC  325 mg Oral Daily  . haloperidol  1 mg Oral BID  . hydrALAZINE  5 mg Intravenous Once  . hydrALAZINE  25 mg Oral Q8H  . hydrochlorothiazide  25 mg Oral Daily  . polyethylene glycol  17 g Oral Daily  . potassium chloride  40 mEq Oral BID  . sodium chloride  3 mL Intravenous Q12H  . sodium chloride  3 mL Intravenous Q12H  . traZODone  100 mg Oral QHS  . venlafaxine XR  75 mg Oral Daily   Continuous Infusions:

## 2012-04-12 NOTE — Discharge Summary (Signed)
Physician Discharge Summary  Mark Lozano ZOX:[id number] DOB: [birth year] DOA: 04/10/2012  PCP: Sheila Oats, MD, MD  Admit date: 04/10/2012 Discharge date: 04/12/2012  Discharge Diagnoses:  Patient Active Hospital Problem List: -malignant hypertension secondary to noncompliance -Bradycardia -Atypical chest pain  Discharge Condition: Stable  Disposition: Home  History of present illness:  46 year old male with known history of hypertension for more than 20 years who has just recently released from prison last week and has not been taking his antihypertensives had gone to followup with [facility] psychiatric facility for followup and medication refill for his depression medication. Over there he was found to be having high blood pressure and was referred to the urgent care at [facility]. In addition patient also stated he had chest pain 2 days ago which lasted for 2 minutes. It was retrosternal radiating to the back pressure-like associated with some nausea but denies any shortness of breath, palpitations, or diaphoresis. In the ER patient was initially given clonidine followed by some hydralazine IV and has been admitted for further management. Patient had cardiac enzymes EKG and chest x-rays done. Chest x-ray and cardiac enzymes did not show anything acute. EKG shows T-wave changes in the lateral leads and inferior leads. Patient presently chest pain-free and has been admitted. Patient states today morning he did take one dose of clonidine from his old supply. He took medicine because he was feeling uncomfortable and had frontal headache. Presently he has no headache. Patient states he was treated in the prison with antihypertensives the names of which he does not recall and has not been taking it for last one week since his release.   Hospital Course:  Hypertension, accelerated (04/10/2012) -this probably secondary to not being compliant with this medication. He was admitted to the telemetry unit  cardiac enzymes were cycled which were negative 2-D echo was done that showed results above. His blood pressure trended down very nicely.: Height chlorothiazide 25 mg daily Catapres and hydralazine. On the day of discharge his blood pressure is 118/77. He will followup with her primary care doctor in 2 weeks here will check a basic metabolic panel.   Bradycardia (10/14/2011)  probably secondary to reflex secondary to his elevated blood pressure. This resolved he was monitored on telemetry with no events.  Chest pain (04/10/2012)  he was admitted to the telemetry unit he had no events. Cardiac enzymes were cycled which were negative x3 2-D echo was done with results as below. This most likely secondary to his elevated blood pressure. Which is now resolved. He had no further evidence of chest pain.  Discharge Exam: Filed Vitals:   04/12/12 0508  BP: 118/77  Pulse: 66  Temp: 97 F (36.1 C)  Resp: 18   Filed Vitals:   04/11/12 1000 04/11/12 1345 04/11/12 2100 04/12/12 0508  BP: 148/82 152/89 142/96 118/77  Pulse:  69 62 66  Temp:  97.8 F (36.6 C) 97.5 F (36.4 C) 97 F (36.1 C)  TempSrc:  Oral Oral Oral  Resp:  18 18 18   Height:      Weight:    80.65 kg (177 lb 12.8 oz)  SpO2:  100% 99% 96%   General: Alert, awake, oriented x3, in no acute distress.  HEENT: No bruits, no goiter.  Heart: Regular rate and rhythm, without murmurs, rubs, gallops.  Lungs: Good air movement, bilateral air movement.  Abdomen: Soft, nontender, nondistended, positive bowel sounds.  Neuro: Grossly intact, nonfocal.   Discharge Instructions  Discharge Orders    Future  Orders Please Complete By Expires   Diet - low sodium heart healthy      Increase activity slowly        Medication List  As of 04/12/2012  7:48 AM   TAKE these medications         cloNIDine 0.1 MG tablet   Commonly known as: CATAPRES   Take 1 tablet (0.1 mg total) by mouth daily.      haloperidol 1 MG tablet   Commonly known as:  HALDOL   Take 1 mg by mouth 2 (two) times daily. Patient not sure of milligram      hydrALAZINE 25 MG tablet   Commonly known as: APRESOLINE   Take 1 tablet (25 mg total) by mouth every 8 (eight) hours.      hydrochlorothiazide 25 MG tablet   Commonly known as: HYDRODIURIL   Take 1 tablet (25 mg total) by mouth daily.      polyethylene glycol packet   Commonly known as: MIRALAX / GLYCOLAX   Take 17 g by mouth daily.      traZODone 100 MG tablet   Commonly known as: DESYREL   Take 100 mg by mouth at bedtime.      venlafaxine XR 75 MG 24 hr capsule   Commonly known as: EFFEXOR-XR   Take 75 mg by mouth daily.           Follow-up Information    Follow up with DEFAULT,PROVIDER, MD in 1 week.   Contact information:   [address] [place] [address] [phone]           The results of significant diagnostics from this hospitalization (including imaging, microbiology, ancillary and laboratory) are listed below for reference.    Significant Diagnostic Studies: Dg Chest 2 View  04/10/2012  *RADIOLOGY REPORT*  Clinical Data: Headache for 2 days. No chest pain or shortness of breath.  History of bradycardia, hypertension, tobacco abuse.  CHEST - 2 VIEW  Comparison: None.  Findings:  The heart size and mediastinal contours are within normal limits.  Both lungs are clear.  The visualized skeletal structures are unremarkable. Surgical clips left neck, uncertain significance.  IMPRESSION: No active cardiopulmonary disease.  Original Report Authenticated By: Elsie Stain, M.D.    Microbiology: No results found for this or any previous visit (from the past 240 hour(s)).   Labs: Basic Metabolic Panel:  Lab 04/11/12 3875 04/10/12 1610  NA 139 142  K 3.1* 3.8  CL 101 103  CO2 27 --  GLUCOSE 107* 128*  BUN 9 14  CREATININE 0.75 1.00  CALCIUM 9.4 --  MG -- --  PHOS -- --   Liver Function Tests:  Lab 04/11/12 0624  AST 30  ALT 33  ALKPHOS 83  BILITOT 0.7   PROT 6.9  ALBUMIN 3.5   No results found for this basename: LIPASE:5,AMYLASE:5 in the last 168 hours No results found for this basename: AMMONIA:5 in the last 168 hours CBC:  Lab 04/11/12 0624 04/10/12 1610  WBC 5.4 --  NEUTROABS -- --  HGB 13.8 15.6  HCT 38.7* 46.0  MCV 88.6 --  PLT 231 --   Cardiac Enzymes:  Lab 04/11/12 1428 04/11/12 0624 04/10/12 2244  CKTOTAL 142 152 198  CKMB 3.6 4.0 4.5*  CKMBINDEX -- -- --  TROPONINI <0.30 <0.30 <0.30   BNP: No components found with this basename: POCBNP:5 CBG:  Lab 04/11/12 0651  GLUCAP 111*    Time coordinating discharge:  35 minutes  Signed:  Marinda Elk  [facility] 04/12/2012, 7:48 AM

## 2012-04-12 NOTE — Progress Notes (Signed)
IV d/c'd.  Tele d/c'd.  Pt d/c'd to facility.  Home meds and d/c instructions discussed with pt.  Pt denies any questions or concerns at this time.  Pt leaving unit escorted by facility staff and appears in no acute distress. Nino Glow RN

## 2012-08-16 ENCOUNTER — Encounter (HOSPITAL_COMMUNITY): Payer: Self-pay | Admitting: *Deleted

## 2012-08-16 ENCOUNTER — Emergency Department (INDEPENDENT_AMBULATORY_CARE_PROVIDER_SITE_OTHER)
Admission: EM | Admit: 2012-08-16 | Discharge: 2012-08-16 | Disposition: A | Discharge status: Home / Self Care | Payer: Self-pay | Source: Home / Self Care | Attending: Emergency Medicine | Admitting: Emergency Medicine

## 2012-08-16 DIAGNOSIS — M549 Dorsalgia, unspecified: Secondary | ICD-10-CM

## 2012-08-16 MED ORDER — CYCLOBENZAPRINE HCL 10 MG PO TABS: 10.0000 mg | ORAL_TABLET | Freq: Every evening | ORAL | Status: AC | PRN

## 2012-08-16 MED ORDER — IBUPROFEN 800 MG PO TABS: 800.0000 mg | ORAL_TABLET | Freq: Three times a day (TID) | ORAL | Status: AC

## 2012-08-16 MED ORDER — IBUPROFEN 800 MG PO TABS: 800.0000 mg | ORAL_TABLET | Freq: Once | ORAL | Status: DC
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
## 2012-10-18 ENCOUNTER — Emergency Department (HOSPITAL_COMMUNITY)
Admission: EM | Admit: 2012-10-18 | Discharge: 2012-10-18 | Disposition: A | Discharge status: Home Health | Payer: Self-pay | Attending: Emergency Medicine | Admitting: Emergency Medicine

## 2012-10-18 ENCOUNTER — Encounter (HOSPITAL_COMMUNITY): Payer: Self-pay | Admitting: Emergency Medicine

## 2012-10-18 ENCOUNTER — Emergency Department (INDEPENDENT_AMBULATORY_CARE_PROVIDER_SITE_OTHER)
Admission: EM | Admit: 2012-10-18 | Discharge: 2012-10-18 | Disposition: A | Discharge status: Nursing Home (Includes ICF) | Payer: Self-pay | Source: Home / Self Care | Attending: Family Medicine | Admitting: Family Medicine

## 2012-10-18 DIAGNOSIS — I161 Hypertensive emergency: Secondary | ICD-10-CM

## 2012-10-18 DIAGNOSIS — R45851 Suicidal ideations: Secondary | ICD-10-CM | POA: Insufficient documentation

## 2012-10-18 DIAGNOSIS — Z79899 Other long term (current) drug therapy: Secondary | ICD-10-CM | POA: Insufficient documentation

## 2012-10-18 DIAGNOSIS — F17200 Nicotine dependence, unspecified, uncomplicated: Secondary | ICD-10-CM | POA: Insufficient documentation

## 2012-10-18 DIAGNOSIS — I10 Essential (primary) hypertension: Secondary | ICD-10-CM | POA: Insufficient documentation

## 2012-10-18 DIAGNOSIS — I498 Other specified cardiac arrhythmias: Secondary | ICD-10-CM | POA: Insufficient documentation

## 2012-10-18 DIAGNOSIS — F4310 Post-traumatic stress disorder, unspecified: Secondary | ICD-10-CM | POA: Insufficient documentation

## 2012-10-18 LAB — URINALYSIS, ROUTINE W REFLEX MICROSCOPIC
Bilirubin Urine: NEGATIVE
Glucose, UA: NEGATIVE mg/dL
Hgb urine dipstick: NEGATIVE
Ketones, ur: NEGATIVE mg/dL
Leukocytes, UA: NEGATIVE
Nitrite: NEGATIVE
Protein, ur: NEGATIVE mg/dL
Specific Gravity, Urine: 1.015 (ref 1.005–1.030)
Urobilinogen, UA: 1 mg/dL (ref 0.0–1.0)
pH: 6 (ref 5.0–8.0)

## 2012-10-18 LAB — CBC
HCT: 42.9 % (ref 39.0–52.0)
Hemoglobin: 15.5 g/dL (ref 13.0–17.0)
MCH: 32.7 pg (ref 26.0–34.0)
MCHC: 36.1 g/dL — ABNORMAL HIGH (ref 30.0–36.0)
MCV: 90.5 fL (ref 78.0–100.0)
Platelets: 221 10*3/uL (ref 150–400)
RBC: 4.74 MIL/uL (ref 4.22–5.81)
RDW: 12.5 % (ref 11.5–15.5)
WBC: 5.1 10*3/uL (ref 4.0–10.5)

## 2012-10-18 LAB — POCT I-STAT, CHEM 8
Calcium, Ion: 1.32 mmol/L — ABNORMAL HIGH (ref 1.12–1.23)
Glucose, Bld: 119 mg/dL — ABNORMAL HIGH (ref 70–99)
HCT: 48 % (ref 39.0–52.0)
Hemoglobin: 16.3 g/dL (ref 13.0–17.0)
Potassium: 3.9 mEq/L (ref 3.5–5.1)

## 2012-10-18 LAB — BASIC METABOLIC PANEL
BUN: 11 mg/dL (ref 6–23)
CO2: 30 mEq/L (ref 19–32)
Calcium: 9.5 mg/dL (ref 8.4–10.5)
Chloride: 101 mEq/L (ref 96–112)
Creatinine, Ser: 0.92 mg/dL (ref 0.50–1.35)
GFR calc Af Amer: >90 mL/min (ref >90)
GFR calc non Af Amer: >90 mL/min (ref >90)
Glucose, Bld: 78 mg/dL (ref 70–99)
Potassium: 3.9 mEq/L (ref 3.5–5.1)
Sodium: 139 mEq/L (ref 135–145)

## 2012-10-18 MED ORDER — ACETAMINOPHEN 325 MG PO TABS
975.0000 mg | ORAL_TABLET | Freq: Once | ORAL | Status: AC
  Administered 2012-10-18: 975 mg via ORAL
  Filled 2012-10-18: qty 3

## 2012-10-18 MED ORDER — CLONIDINE HCL 0.1 MG PO TABS
0.1000 mg | ORAL_TABLET | Freq: Once | ORAL | Status: AC
  Administered 2012-10-18: 0.1 mg via ORAL

## 2012-10-18 MED ORDER — IBUPROFEN 800 MG PO TABS
800.0000 mg | ORAL_TABLET | Freq: Once | ORAL | Status: AC
  Administered 2012-10-18: 800 mg via ORAL
  Filled 2012-10-18: qty 1

## 2012-10-18 MED ORDER — HYDROCHLOROTHIAZIDE 25 MG PO TABS: 25.0000 mg | ORAL_TABLET | Freq: Every day | ORAL | Status: DC

## 2012-10-18 MED ORDER — FUROSEMIDE 10 MG/ML IJ SOLN
INTRAMUSCULAR | Status: AC
  Filled 2012-10-18: qty 2

## 2012-10-18 MED ORDER — FUROSEMIDE 10 MG/ML IJ SOLN: 20.0000 mg | Freq: Once | INTRAMUSCULAR | Status: DC

## 2012-10-18 MED ORDER — ACETAMINOPHEN 500 MG PO TABS
1000.0000 mg | ORAL_TABLET | Freq: Once | ORAL | Status: DC
  Filled 2012-10-18: qty 2

## 2012-10-18 MED ORDER — FUROSEMIDE 10 MG/ML IJ SOLN
10.0000 mg | Freq: Once | INTRAMUSCULAR | Status: AC
  Administered 2012-10-18: 10 mg via INTRAVENOUS

## 2012-10-18 MED ORDER — AMLODIPINE BESYLATE 5 MG PO TABS: 5.0000 mg | ORAL_TABLET | Freq: Every day | ORAL | Status: DC

## 2012-10-18 MED ORDER — FUROSEMIDE 10 MG/ML IJ SOLN: 10.0000 mg | Freq: Once | INTRAMUSCULAR | Status: DC

## 2012-10-18 MED ORDER — CLONIDINE HCL 0.1 MG PO TABS
ORAL_TABLET | ORAL | Status: AC
  Filled 2012-10-18: qty 1

## 2012-10-18 NOTE — ED Notes (Signed)
Notified [facility] 
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
## 2012-10-18 NOTE — ED Notes (Signed)
Pt presents from Urgent care with complaint of HTN and Headache. Has not taken his meds x 2 months.  Given 0.1mg  clonidine and 10mg  of Lasix pta. Pt alert and oriented on arrival to ED

## 2012-10-18 NOTE — ED Notes (Signed)
2 day history of terrible headache.  Denies chest pain.  Last blood pressure medicine was 2 months ago

## 2012-10-18 NOTE — ED Notes (Signed)
Per Dr. Alfonse Ras - IV of 0.9% NS @ KVO.

## 2012-10-18 NOTE — ED Notes (Signed)
Denies chest pain, numbness, tingling or weakness

## 2012-10-18 NOTE — ED Notes (Signed)
States he was off his behavioral health meds x3 weeks but finally got them back this week.  States the rehab facility still hasn't given him his HTn medications

## 2012-10-19 NOTE — ED Provider Notes (Signed)
Medical screening examination/treatment/procedure(s) were performed by non-physician practitioner and as supervising physician I was immediately available for consultation/collaboration.  Geoffery Lyons, MD 10/19/12 [phone]
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
## 2017-07-24 ENCOUNTER — Emergency Department (HOSPITAL_COMMUNITY)
Admission: EM | Admit: 2017-07-24 | Discharge: 2017-07-24 | Disposition: A | Discharge status: Home / Self Care | Payer: Self-pay | Attending: Emergency Medicine | Admitting: Emergency Medicine

## 2017-07-24 ENCOUNTER — Encounter (HOSPITAL_COMMUNITY): Payer: Self-pay | Admitting: Emergency Medicine

## 2017-07-24 ENCOUNTER — Emergency Department (HOSPITAL_COMMUNITY): Payer: Self-pay

## 2017-07-24 DIAGNOSIS — R1011 Right upper quadrant pain: Secondary | ICD-10-CM | POA: Insufficient documentation

## 2017-07-24 DIAGNOSIS — Z79899 Other long term (current) drug therapy: Secondary | ICD-10-CM | POA: Insufficient documentation

## 2017-07-24 DIAGNOSIS — R079 Chest pain, unspecified: Secondary | ICD-10-CM | POA: Insufficient documentation

## 2017-07-24 DIAGNOSIS — F17200 Nicotine dependence, unspecified, uncomplicated: Secondary | ICD-10-CM | POA: Insufficient documentation

## 2017-07-24 DIAGNOSIS — R109 Unspecified abdominal pain: Secondary | ICD-10-CM

## 2017-07-24 DIAGNOSIS — I10 Essential (primary) hypertension: Secondary | ICD-10-CM | POA: Insufficient documentation

## 2017-07-24 LAB — COMPREHENSIVE METABOLIC PANEL
ALBUMIN: 4.2 g/dL (ref 3.5–5.0)
ALT: 22 U/L (ref 17–63)
ANION GAP: 8 (ref 5–15)
AST: 24 U/L (ref 15–41)
Alkaline Phosphatase: 70 U/L (ref 38–126)
BILIRUBIN TOTAL: 0.5 mg/dL (ref 0.3–1.2)
BUN: 11 mg/dL (ref 6–20)
CHLORIDE: 105 mmol/L (ref 101–111)
CO2: 23 mmol/L (ref 22–32)
Calcium: 9.6 mg/dL (ref 8.9–10.3)
Creatinine, Ser: 0.82 mg/dL (ref 0.61–1.24)
GFR calc Af Amer: >60 mL/min (ref >60)
GFR calc non Af Amer: >60 mL/min (ref >60)
GLUCOSE: 113 mg/dL — AB (ref 65–99)
POTASSIUM: 3.9 mmol/L (ref 3.5–5.1)
SODIUM: 136 mmol/L (ref 135–145)
TOTAL PROTEIN: 8.1 g/dL (ref 6.5–8.1)

## 2017-07-24 LAB — CBC
HCT: 41.3 % (ref 39.0–52.0)
HEMOGLOBIN: 14.8 g/dL (ref 13.0–17.0)
MCH: 32.9 pg (ref 26.0–34.0)
MCHC: 35.8 g/dL (ref 30.0–36.0)
MCV: 91.8 fL (ref 78.0–100.0)
Platelets: 224 10*3/uL (ref 150–400)
RBC: 4.5 MIL/uL (ref 4.22–5.81)
RDW: 12.5 % (ref 11.5–15.5)
WBC: 6.5 10*3/uL (ref 4.0–10.5)

## 2017-07-24 LAB — PROTIME-INR
INR: 0.95
PROTHROMBIN TIME: 12.6 s (ref 11.4–15.2)

## 2017-07-24 LAB — URINALYSIS, ROUTINE W REFLEX MICROSCOPIC
BILIRUBIN URINE: NEGATIVE
GLUCOSE, UA: NEGATIVE mg/dL
Hgb urine dipstick: NEGATIVE
Ketones, ur: NEGATIVE mg/dL
LEUKOCYTES UA: NEGATIVE
NITRITE: NEGATIVE
Protein, ur: NEGATIVE mg/dL
SPECIFIC GRAVITY, URINE: 1.015 (ref 1.005–1.030)
pH: 7 (ref 5.0–8.0)

## 2017-07-24 LAB — I-STAT CG4 LACTIC ACID, ED: Lactic Acid, Venous: 1.23 mmol/L (ref 0.5–1.9)

## 2017-07-24 LAB — I-STAT TROPONIN, ED: Troponin i, poc: 0 ng/mL (ref 0.00–0.08)

## 2017-07-24 LAB — LIPASE, BLOOD: LIPASE: 25 U/L (ref 11–51)

## 2017-07-24 MED ORDER — MORPHINE SULFATE (PF) 2 MG/ML IV SOLN
4.0000 mg | Freq: Once | INTRAVENOUS | Status: AC
  Administered 2017-07-24: 4 mg via INTRAVENOUS
  Filled 2017-07-24: qty 2

## 2017-07-24 MED ORDER — ONDANSETRON HCL 4 MG/2ML IJ SOLN
4.0000 mg | Freq: Once | INTRAMUSCULAR | Status: AC
  Administered 2017-07-24: 4 mg via INTRAVENOUS
  Filled 2017-07-24: qty 2

## 2017-07-24 MED ORDER — IOPAMIDOL (ISOVUE-370) INJECTION 76%
INTRAVENOUS | Status: AC
  Filled 2017-07-24: qty 100

## 2017-07-24 MED ORDER — AMLODIPINE BESYLATE 5 MG PO TABS: 5.0000 mg | ORAL_TABLET | Freq: Every day | ORAL | 0 refills | Status: DC

## 2017-07-24 MED ORDER — AMLODIPINE BESYLATE 5 MG PO TABS
5.0000 mg | ORAL_TABLET | Freq: Once | ORAL | Status: AC
Start: 2017-07-24 — End: 2017-07-24
  Administered 2017-07-24: 5 mg via ORAL
  Filled 2017-07-24: qty 1

## 2017-07-24 MED ORDER — ONDANSETRON 4 MG PO TBDP
4.0000 mg | ORAL_TABLET | Freq: Once | ORAL | Status: DC | PRN
  Filled 2017-07-24: qty 1

## 2017-07-24 MED ORDER — OXYCODONE-ACETAMINOPHEN 5-325 MG PO TABS: 1.0000 | ORAL_TABLET | ORAL | 0 refills | Status: DC | PRN

## 2017-07-24 MED ORDER — HYDROCHLOROTHIAZIDE 25 MG PO TABS: 25.0000 mg | ORAL_TABLET | Freq: Every day | ORAL | 0 refills | Status: DC

## 2017-07-24 MED ORDER — IOPAMIDOL (ISOVUE-370) INJECTION 76%
100.0000 mL | Freq: Once | INTRAVENOUS | Status: AC | PRN
  Administered 2017-07-24: 100 mL via INTRAVENOUS

## 2017-07-24 MED ORDER — OXYCODONE-ACETAMINOPHEN 5-325 MG PO TABS
1.0000 | ORAL_TABLET | Freq: Once | ORAL | Status: AC
  Administered 2017-07-24: 1 via ORAL
  Filled 2017-07-24: qty 1

## 2017-07-24 MED ORDER — ONDANSETRON HCL 4 MG PO TABS: 4.0000 mg | ORAL_TABLET | Freq: Three times a day (TID) | ORAL | 0 refills | Status: DC | PRN

## 2017-07-24 MED ORDER — HYDRALAZINE HCL 25 MG PO TABS
25.0000 mg | ORAL_TABLET | Freq: Once | ORAL | Status: AC
  Administered 2017-07-24: 25 mg via ORAL
  Filled 2017-07-24: qty 1

## 2017-07-24 MED ORDER — SODIUM CHLORIDE 0.9 % IV BOLUS (SEPSIS)
1000.0000 mL | Freq: Once | INTRAVENOUS | Status: AC
  Administered 2017-07-24: 1000 mL via INTRAVENOUS

## 2017-07-24 NOTE — ED Notes (Signed)
EDMD at bedside

## 2017-07-24 NOTE — ED Triage Notes (Signed)
Per pt, states vomiting and diarrhea since 0300-states RUQ pain
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
## 2017-07-24 NOTE — ED Notes (Signed)
Bed: WA06 Expected date:  Expected time:  Means of arrival:  Comments: 

## 2017-07-24 NOTE — ED Notes (Signed)
Patient will be approved for 1234, when room is clean. Clement Sayres, RN [phone]) will be receiving the patient if ED nurse would like to call and give phone report. We will approve bed as soon as room is clean.

## 2017-07-24 NOTE — ED Notes (Signed)
Patient provided with milk and graham crackers on request. Patient tolerating well. No nausea voiced at this time.

## 2017-07-24 NOTE — ED Notes (Addendum)
Patient tolerated PO challenge without issue

## 2017-07-24 NOTE — Discharge Instructions (Signed)
Your workup today showed no evidence of acute infection or significant intra-abdominal pathology. I suspect you have a GI bug causing her nausea, vomiting, diarrhea, and pain. Please use the pain and nausea medicine to help with your symptoms. Please call to schedule a follow-up appointment with a primary care physician for further management. Please use the blood pressure medicine to help with her blood pressure. If any symptoms change or worsen, please return to the nearest emergency department.

## 2017-07-24 NOTE — ED Notes (Signed)
Patient transported to CT 

## 2017-08-14 MED FILL — HYDROCHLOROTHIAZIDE 25 MG T: 25 | 30 days supply | Qty: 30 | Fill #0

## 2017-08-14 MED FILL — AMLODIPINE BESYLATE 5 MG TA: 5 | 30 days supply | Qty: 30 | Fill #0

## 2017-08-31 ENCOUNTER — Ambulatory Visit: Payer: Self-pay | Admitting: Family Medicine

## 2017-12-20 ENCOUNTER — Encounter (HOSPITAL_COMMUNITY): Payer: Self-pay | Admitting: Emergency Medicine

## 2017-12-20 ENCOUNTER — Emergency Department (HOSPITAL_COMMUNITY)
Admission: EM | Admit: 2017-12-20 | Discharge: 2017-12-20 | Disposition: A | Discharge status: Home / Self Care | Payer: Self-pay | Attending: Emergency Medicine | Admitting: Emergency Medicine

## 2017-12-20 DIAGNOSIS — F17210 Nicotine dependence, cigarettes, uncomplicated: Secondary | ICD-10-CM | POA: Insufficient documentation

## 2017-12-20 DIAGNOSIS — F4310 Post-traumatic stress disorder, unspecified: Secondary | ICD-10-CM | POA: Insufficient documentation

## 2017-12-20 DIAGNOSIS — I10 Essential (primary) hypertension: Secondary | ICD-10-CM | POA: Insufficient documentation

## 2017-12-20 DIAGNOSIS — Z760 Encounter for issue of repeat prescription: Secondary | ICD-10-CM | POA: Insufficient documentation

## 2017-12-20 DIAGNOSIS — R51 Headache: Secondary | ICD-10-CM | POA: Insufficient documentation

## 2017-12-20 MED ORDER — HYDRALAZINE HCL 25 MG PO TABS
25.0000 mg | ORAL_TABLET | Freq: Once | ORAL | Status: AC
  Administered 2017-12-20: 25 mg via ORAL
  Filled 2017-12-20: qty 1

## 2017-12-20 MED ORDER — HYDROCHLOROTHIAZIDE 25 MG PO TABS: 25.0000 mg | ORAL_TABLET | Freq: Every day | ORAL | 0 refills | Status: DC

## 2017-12-20 MED ORDER — HYDROCHLOROTHIAZIDE 12.5 MG PO CAPS
25.0000 mg | ORAL_CAPSULE | Freq: Once | ORAL | Status: AC
  Administered 2017-12-20: 25 mg via ORAL
  Filled 2017-12-20: qty 2

## 2017-12-20 MED ORDER — HYDRALAZINE HCL 25 MG PO TABS: 25.0000 mg | ORAL_TABLET | Freq: Three times a day (TID) | ORAL | 0 refills | Status: DC

## 2017-12-20 MED ORDER — AMLODIPINE BESYLATE 5 MG PO TABS: 5.0000 mg | ORAL_TABLET | Freq: Every day | ORAL | 0 refills | Status: DC

## 2017-12-20 MED ORDER — CLONIDINE HCL 0.1 MG PO TABS: 0.1000 mg | ORAL_TABLET | Freq: Every day | ORAL | 0 refills | Status: DC

## 2017-12-20 MED ORDER — CLONIDINE HCL 0.1 MG PO TABS
0.1000 mg | ORAL_TABLET | Freq: Once | ORAL | Status: AC
  Administered 2017-12-20: 0.1 mg via ORAL
  Filled 2017-12-20: qty 1

## 2017-12-20 MED ORDER — AMLODIPINE BESYLATE 5 MG PO TABS
5.0000 mg | ORAL_TABLET | Freq: Once | ORAL | Status: AC
  Administered 2017-12-20: 5 mg via ORAL
  Filled 2017-12-20: qty 1
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
## 2017-12-20 NOTE — ED Triage Notes (Signed)
Patient c/o HTN and hasnt taken BP meds in over 2 months. Reports that he did a lot of cocaine on Dec 28th and when took drug test today he still tested positive and wants to know why still showing in his system.

## 2018-03-15 ENCOUNTER — Encounter (HOSPITAL_COMMUNITY)
Admission: EM | Disposition: A | Discharge status: Home / Self Care | Payer: Self-pay | Source: Home / Self Care | Attending: Cardiology

## 2018-03-15 ENCOUNTER — Inpatient Hospital Stay (HOSPITAL_COMMUNITY)
Admission: EM | Disposition: A | Discharge status: Home / Self Care | Payer: Self-pay | Source: Home / Self Care | Attending: Cardiology

## 2018-03-15 ENCOUNTER — Encounter (HOSPITAL_COMMUNITY): Payer: Self-pay

## 2018-03-15 ENCOUNTER — Emergency Department (HOSPITAL_COMMUNITY): Payer: Self-pay

## 2018-03-15 ENCOUNTER — Inpatient Hospital Stay (HOSPITAL_COMMUNITY): Payer: Self-pay

## 2018-03-15 ENCOUNTER — Other Ambulatory Visit: Payer: Self-pay

## 2018-03-15 ENCOUNTER — Inpatient Hospital Stay (HOSPITAL_COMMUNITY)
Admission: EM | Admit: 2018-03-15 | Discharge: 2018-03-19 | DRG: 247 | Disposition: A | Discharge status: Home / Self Care | Payer: Self-pay | Attending: Cardiology | Admitting: Cardiology

## 2018-03-15 DIAGNOSIS — Z79899 Other long term (current) drug therapy: Secondary | ICD-10-CM

## 2018-03-15 DIAGNOSIS — I10 Essential (primary) hypertension: Secondary | ICD-10-CM | POA: Diagnosis present

## 2018-03-15 DIAGNOSIS — F1410 Cocaine abuse, uncomplicated: Secondary | ICD-10-CM | POA: Diagnosis present

## 2018-03-15 DIAGNOSIS — I2129 ST elevation (STEMI) myocardial infarction involving other sites: Principal | ICD-10-CM | POA: Diagnosis present

## 2018-03-15 DIAGNOSIS — I2510 Atherosclerotic heart disease of native coronary artery without angina pectoris: Secondary | ICD-10-CM | POA: Diagnosis present

## 2018-03-15 DIAGNOSIS — F4310 Post-traumatic stress disorder, unspecified: Secondary | ICD-10-CM | POA: Diagnosis present

## 2018-03-15 DIAGNOSIS — Z833 Family history of diabetes mellitus: Secondary | ICD-10-CM

## 2018-03-15 DIAGNOSIS — Z8249 Family history of ischemic heart disease and other diseases of the circulatory system: Secondary | ICD-10-CM

## 2018-03-15 DIAGNOSIS — Z955 Presence of coronary angioplasty implant and graft: Secondary | ICD-10-CM

## 2018-03-15 DIAGNOSIS — I213 ST elevation (STEMI) myocardial infarction of unspecified site: Secondary | ICD-10-CM

## 2018-03-15 DIAGNOSIS — F17200 Nicotine dependence, unspecified, uncomplicated: Secondary | ICD-10-CM | POA: Diagnosis present

## 2018-03-15 DIAGNOSIS — Z590 Homelessness: Secondary | ICD-10-CM

## 2018-03-15 HISTORY — PX: LEFT HEART CATH AND CORONARY ANGIOGRAPHY: CATH118249

## 2018-03-15 HISTORY — PX: CORONARY/GRAFT ACUTE MI REVASCULARIZATION: CATH118305

## 2018-03-15 LAB — COMPREHENSIVE METABOLIC PANEL
ALBUMIN: 3.8 g/dL (ref 3.5–5.0)
ALT: 39 U/L (ref 17–63)
AST: 40 U/L (ref 15–41)
Alkaline Phosphatase: 65 U/L (ref 38–126)
Anion gap: 14 (ref 5–15)
BUN: 8 mg/dL (ref 6–20)
CHLORIDE: 102 mmol/L (ref 101–111)
CO2: 24 mmol/L (ref 22–32)
Calcium: 9.6 mg/dL (ref 8.9–10.3)
Creatinine, Ser: 1.17 mg/dL (ref 0.61–1.24)
GFR calc Af Amer: >60 mL/min (ref >60)
GFR calc non Af Amer: >60 mL/min (ref >60)
GLUCOSE: 102 mg/dL — AB (ref 65–99)
POTASSIUM: 3.7 mmol/L (ref 3.5–5.1)
Sodium: 140 mmol/L (ref 135–145)
Total Bilirubin: 0.8 mg/dL (ref 0.3–1.2)
Total Protein: 7.3 g/dL (ref 6.5–8.1)

## 2018-03-15 LAB — POCT I-STAT, CHEM 8
BUN: 10 mg/dL (ref 6–20)
CREATININE: 1 mg/dL (ref 0.61–1.24)
Calcium, Ion: 1.2 mmol/L (ref 1.15–1.40)
Chloride: 103 mmol/L (ref 101–111)
GLUCOSE: 108 mg/dL — AB (ref 65–99)
HCT: 40 % (ref 39.0–52.0)
HEMOGLOBIN: 13.6 g/dL (ref 13.0–17.0)
POTASSIUM: 3.7 mmol/L (ref 3.5–5.1)
Sodium: 141 mmol/L (ref 135–145)
TCO2: 26 mmol/L (ref 22–32)

## 2018-03-15 LAB — CBC WITH DIFFERENTIAL/PLATELET
BASOS ABS: 0 10*3/uL (ref 0.0–0.1)
BASOS PCT: 0 %
Eosinophils Absolute: 0.1 10*3/uL (ref 0.0–0.7)
Eosinophils Relative: 2 %
HEMATOCRIT: 43 % (ref 39.0–52.0)
HEMOGLOBIN: 14.9 g/dL (ref 13.0–17.0)
LYMPHS PCT: 29 %
Lymphs Abs: 1.6 10*3/uL (ref 0.7–4.0)
MCH: 32.8 pg (ref 26.0–34.0)
MCHC: 34.7 g/dL (ref 30.0–36.0)
MCV: 94.7 fL (ref 78.0–100.0)
Monocytes Absolute: 0.5 10*3/uL (ref 0.1–1.0)
Monocytes Relative: 8 %
NEUTROS ABS: 3.3 10*3/uL (ref 1.7–7.7)
NEUTROS PCT: 61 %
Platelets: 212 10*3/uL (ref 150–400)
RBC: 4.54 MIL/uL (ref 4.22–5.81)
RDW: 13.7 % (ref 11.5–15.5)
WBC: 5.5 10*3/uL (ref 4.0–10.5)

## 2018-03-15 LAB — PROTIME-INR
INR: 1.05
Prothrombin Time: 13.6 seconds (ref 11.4–15.2)

## 2018-03-15 LAB — LIPID PANEL
CHOL/HDL RATIO: 2.7 ratio
Cholesterol: 168 mg/dL (ref 0–200)
HDL: 62 mg/dL (ref >40)
LDL CALC: 91 mg/dL (ref 0–99)
Triglycerides: 74 mg/dL (ref <150)
VLDL: 15 mg/dL (ref 0–40)

## 2018-03-15 LAB — TROPONIN I
Troponin I: 0.39 ng/mL (ref <0.03)
Troponin I: >65 ng/mL (ref <0.03)

## 2018-03-15 LAB — APTT: APTT: 26 s (ref 24–36)

## 2018-03-15 LAB — TSH: TSH: 0.467 u[IU]/mL (ref 0.350–4.500)

## 2018-03-15 LAB — POCT ACTIVATED CLOTTING TIME
Activated Clotting Time: 318 seconds
Activated Clotting Time: 345 seconds

## 2018-03-15 SURGERY — LEFT HEART CATH AND CORONARY ANGIOGRAPHY
Anesthesia: LOCAL

## 2018-03-15 MED ORDER — VERAPAMIL HCL 2.5 MG/ML IV SOLN
INTRAVENOUS | Status: AC
  Filled 2018-03-15: qty 2

## 2018-03-15 MED ORDER — VERAPAMIL HCL 2.5 MG/ML IV SOLN
INTRAVENOUS | Status: DC | PRN
  Administered 2018-03-15: 10 mL via INTRA_ARTERIAL

## 2018-03-15 MED ORDER — HEPARIN (PORCINE) IN NACL 2-0.9 UNIT/ML-% IJ SOLN
INTRAMUSCULAR | Status: AC
  Filled 2018-03-15: qty 1000

## 2018-03-15 MED ORDER — ATORVASTATIN CALCIUM 80 MG PO TABS
80.0000 mg | ORAL_TABLET | Freq: Every day | ORAL | Status: DC
  Administered 2018-03-16 – 2018-03-18 (×3): 80 mg via ORAL
  Filled 2018-03-15 (×3): qty 1

## 2018-03-15 MED ORDER — ASPIRIN EC 81 MG PO TBEC
81.0000 mg | DELAYED_RELEASE_TABLET | Freq: Every day | ORAL | Status: DC
  Administered 2018-03-16 – 2018-03-19 (×4): 81 mg via ORAL
  Filled 2018-03-15 (×4): qty 1

## 2018-03-15 MED ORDER — LIDOCAINE HCL 1 % IJ SOLN
INTRAMUSCULAR | Status: AC
  Filled 2018-03-15: qty 20

## 2018-03-15 MED ORDER — ONDANSETRON HCL 4 MG/2ML IJ SOLN
INTRAMUSCULAR | Status: DC | PRN
  Administered 2018-03-15: 4 mg via INTRAVENOUS

## 2018-03-15 MED ORDER — SODIUM CHLORIDE 0.9 % WEIGHT BASED INFUSION: 1.0000 mL/kg/h | INTRAVENOUS | Status: AC

## 2018-03-15 MED ORDER — SODIUM CHLORIDE 0.9 % IV SOLN
INTRAVENOUS | Status: AC | PRN
  Administered 2018-03-15: 100 mL/h via INTRAVENOUS

## 2018-03-15 MED ORDER — HEPARIN SODIUM (PORCINE) 5000 UNIT/ML IJ SOLN
4000.0000 [IU] | Freq: Once | INTRAMUSCULAR | Status: AC
  Administered 2018-03-15: 4000 [IU] via INTRAVENOUS
  Filled 2018-03-15: qty 1

## 2018-03-15 MED ORDER — HEPARIN SODIUM (PORCINE) 1000 UNIT/ML IJ SOLN
INTRAMUSCULAR | Status: DC | PRN
  Administered 2018-03-15: 2000 [IU] via INTRAVENOUS
  Administered 2018-03-15: 8000 [IU] via INTRAVENOUS

## 2018-03-15 MED ORDER — HEPARIN (PORCINE) IN NACL 2-0.9 UNIT/ML-% IJ SOLN
INTRAMUSCULAR | Status: AC | PRN
  Administered 2018-03-15 (×2): 500 mL

## 2018-03-15 MED ORDER — ONDANSETRON HCL 4 MG/2ML IJ SOLN
INTRAMUSCULAR | Status: AC
  Filled 2018-03-15: qty 2

## 2018-03-15 MED ORDER — HYDRALAZINE HCL 20 MG/ML IJ SOLN: 5.0000 mg | INTRAMUSCULAR | Status: AC | PRN

## 2018-03-15 MED ORDER — IOPAMIDOL (ISOVUE-370) INJECTION 76%
INTRAVENOUS | Status: AC
  Filled 2018-03-15: qty 125

## 2018-03-15 MED ORDER — IOHEXOL 350 MG/ML SOLN
INTRAVENOUS | Status: DC | PRN
  Administered 2018-03-15: 20 mL via INTRA_ARTERIAL

## 2018-03-15 MED ORDER — IOPAMIDOL (ISOVUE-370) INJECTION 76%
INTRAVENOUS | Status: DC | PRN
  Administered 2018-03-15: 115 mL via INTRA_ARTERIAL

## 2018-03-15 MED ORDER — MIDAZOLAM HCL 2 MG/2ML IJ SOLN
INTRAMUSCULAR | Status: AC
  Filled 2018-03-15: qty 2

## 2018-03-15 MED ORDER — LIDOCAINE HCL (PF) 1 % IJ SOLN
INTRAMUSCULAR | Status: DC | PRN
  Administered 2018-03-15: 2 mL

## 2018-03-15 MED ORDER — TICAGRELOR 90 MG PO TABS
90.0000 mg | ORAL_TABLET | Freq: Two times a day (BID) | ORAL | Status: DC
  Administered 2018-03-16 – 2018-03-19 (×7): 90 mg via ORAL
  Filled 2018-03-15 (×8): qty 1

## 2018-03-15 MED ORDER — TICAGRELOR 90 MG PO TABS
ORAL_TABLET | ORAL | Status: AC
  Filled 2018-03-15: qty 2

## 2018-03-15 MED ORDER — ACETAMINOPHEN 325 MG PO TABS: 650.0000 mg | ORAL_TABLET | ORAL | Status: DC | PRN

## 2018-03-15 MED ORDER — SODIUM CHLORIDE 0.9 % IV SOLN
INTRAVENOUS | Status: DC
  Administered 2018-03-15: 20 mL/h via INTRAVENOUS

## 2018-03-15 MED ORDER — SODIUM CHLORIDE 0.9% FLUSH
3.0000 mL | INTRAVENOUS | Status: DC | PRN
  Administered 2018-03-18: 3 mL via INTRAVENOUS
  Filled 2018-03-15: qty 3

## 2018-03-15 MED ORDER — TICAGRELOR 90 MG PO TABS
ORAL_TABLET | ORAL | Status: DC | PRN
  Administered 2018-03-15 (×2): 180 mg via ORAL

## 2018-03-15 MED ORDER — NITROGLYCERIN 0.4 MG SL SUBL
0.4000 mg | SUBLINGUAL_TABLET | SUBLINGUAL | Status: AC | PRN
  Administered 2018-03-15 (×2): 0.4 mg via SUBLINGUAL
  Filled 2018-03-15: qty 1

## 2018-03-15 MED ORDER — LORAZEPAM 2 MG/ML IJ SOLN
1.0000 mg | Freq: Once | INTRAMUSCULAR | Status: AC
  Administered 2018-03-15: 1 mg via INTRAVENOUS
  Filled 2018-03-15: qty 1

## 2018-03-15 MED ORDER — TICAGRELOR 90 MG PO TABS: 90.0000 mg | ORAL_TABLET | Freq: Two times a day (BID) | ORAL | Status: DC

## 2018-03-15 MED ORDER — SODIUM CHLORIDE 0.9% FLUSH
3.0000 mL | Freq: Two times a day (BID) | INTRAVENOUS | Status: DC
  Administered 2018-03-16 – 2018-03-19 (×6): 3 mL via INTRAVENOUS

## 2018-03-15 MED ORDER — LABETALOL HCL 5 MG/ML IV SOLN: 10.0000 mg | INTRAVENOUS | Status: AC | PRN

## 2018-03-15 MED ORDER — MIDAZOLAM HCL 2 MG/2ML IJ SOLN
INTRAMUSCULAR | Status: DC | PRN
  Administered 2018-03-15: 2 mg via INTRAVENOUS
  Administered 2018-03-15: 1 mg via INTRAVENOUS

## 2018-03-15 MED ORDER — NITROGLYCERIN 1 MG/10 ML FOR IR/CATH LAB
INTRA_ARTERIAL | Status: DC | PRN
  Administered 2018-03-15: 200 ug via INTRACORONARY

## 2018-03-15 MED ORDER — CARVEDILOL 6.25 MG PO TABS
6.2500 mg | ORAL_TABLET | Freq: Two times a day (BID) | ORAL | Status: DC
  Administered 2018-03-16 – 2018-03-19 (×7): 6.25 mg via ORAL
  Filled 2018-03-15 (×7): qty 1

## 2018-03-15 MED ORDER — SODIUM CHLORIDE 0.9 % IV SOLN: 250.0000 mL | INTRAVENOUS | Status: DC | PRN

## 2018-03-15 MED ORDER — NITROGLYCERIN 0.4 MG SL SUBL: 0.4000 mg | SUBLINGUAL_TABLET | SUBLINGUAL | Status: DC | PRN

## 2018-03-15 SURGICAL SUPPLY — 22 items
BALLN SAPPHIRE 2.5X12 (BALLOONS) ×2
BALLN SAPPHIRE ~~LOC~~ 2.5X8 (BALLOONS) ×2 IMPLANT
BALLN SAPPHIRE ~~LOC~~ 3.25X12 (BALLOONS) ×2 IMPLANT
BALLN ~~LOC~~ EUPHORA RX 3.5X8 (BALLOONS) ×2
BALLOON SAPPHIRE 2.5X12 (BALLOONS) ×1 IMPLANT
BALLOON ~~LOC~~ EUPHORA RX 3.5X8 (BALLOONS) ×1 IMPLANT
BAND CMPR LRG ZPHR (HEMOSTASIS) ×1
BAND ZEPHYR COMPRESS 30 LONG (HEMOSTASIS) ×2 IMPLANT
CATH INFINITI 5FR MPB2 (CATHETERS) ×2 IMPLANT
CATH VISTA GUIDE 6FR XB3.5 (CATHETERS) ×2 IMPLANT
GLIDESHEATH SLEND A-KIT 6F 20G (SHEATH) ×2 IMPLANT
GUIDEWIRE INQWIRE 1.5J.035X260 (WIRE) ×1 IMPLANT
INQWIRE 1.5J .035X260CM (WIRE) ×2
KIT ENCORE 26 ADVANTAGE (KITS) ×4 IMPLANT
KIT HEART LEFT (KITS) ×2 IMPLANT
KIT HEMO VALVE WATCHDOG (MISCELLANEOUS) ×2 IMPLANT
PACK CARDIAC CATHETERIZATION (CUSTOM PROCEDURE TRAY) ×2 IMPLANT
STENT RESOLUTE ONYX 3.0X18 (Permanent Stent) ×2 IMPLANT
TRANSDUCER W/STOPCOCK (MISCELLANEOUS) ×2 IMPLANT
TUBING CIL FLEX 10 FLL-RA (TUBING) ×2 IMPLANT
WIRE COUGAR XT STRL 190CM (WIRE) ×2 IMPLANT
WIRE RUNTHROUGH .014X180CM (WIRE) ×2 IMPLANT

## 2018-03-15 NOTE — Progress Notes (Signed)
Troponin of 0.39 called from Lab. Dr. Jacinto HalimGanji made aware. No further orders received at this time.
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
## 2018-03-15 NOTE — ED Triage Notes (Signed)
Pt BIB EMS for CP; per EMS called pt code stemi, took pt straight to cath lab who sent pt back down to ED. Pt states he has been having CP all week and today it has been constant with radiation to jaw. Received 324 ASA and 1 nitro PTA.  Pt A&Ox4. Resp e/u. EDP at bedside.

## 2018-03-15 NOTE — Progress Notes (Signed)
MD on call notified about elevated troponin   CRITICAL VALUE ALERT  Critical Value:Troponin greater than 65  Date & Time Notied:  22:20  Provider Notified: 22:25  Orders Received/Actions taken: No new orders received

## 2018-03-16 ENCOUNTER — Inpatient Hospital Stay (HOSPITAL_COMMUNITY): Payer: Self-pay

## 2018-03-16 ENCOUNTER — Encounter (HOSPITAL_COMMUNITY): Payer: Self-pay | Admitting: Cardiology

## 2018-03-16 DIAGNOSIS — I340 Nonrheumatic mitral (valve) insufficiency: Secondary | ICD-10-CM

## 2018-03-16 LAB — CBC
HCT: 37.6 % — ABNORMAL LOW (ref 39.0–52.0)
HEMOGLOBIN: 12.9 g/dL — AB (ref 13.0–17.0)
MCH: 32.3 pg (ref 26.0–34.0)
MCHC: 34.3 g/dL (ref 30.0–36.0)
MCV: 94.2 fL (ref 78.0–100.0)
PLATELETS: 196 10*3/uL (ref 150–400)
RBC: 3.99 MIL/uL — ABNORMAL LOW (ref 4.22–5.81)
RDW: 13.7 % (ref 11.5–15.5)
WBC: 7.1 10*3/uL (ref 4.0–10.5)

## 2018-03-16 LAB — BASIC METABOLIC PANEL
ANION GAP: 10 (ref 5–15)
BUN: 11 mg/dL (ref 6–20)
CALCIUM: 8.8 mg/dL — AB (ref 8.9–10.3)
CO2: 21 mmol/L — ABNORMAL LOW (ref 22–32)
CREATININE: 1.01 mg/dL (ref 0.61–1.24)
Chloride: 107 mmol/L (ref 101–111)
GFR calc Af Amer: >60 mL/min (ref >60)
Glucose, Bld: 103 mg/dL — ABNORMAL HIGH (ref 65–99)
Potassium: 3.8 mmol/L (ref 3.5–5.1)
Sodium: 138 mmol/L (ref 135–145)

## 2018-03-16 LAB — TROPONIN I

## 2018-03-16 LAB — HIV ANTIBODY (ROUTINE TESTING W REFLEX): HIV SCREEN 4TH GENERATION: NONREACTIVE

## 2018-03-16 LAB — ECHOCARDIOGRAM COMPLETE
Height: 67 in
Weight: 2720 oz

## 2018-03-16 LAB — MRSA PCR SCREENING: MRSA BY PCR: NEGATIVE

## 2018-03-16 MED ORDER — LISINOPRIL 10 MG PO TABS
10.0000 mg | ORAL_TABLET | Freq: Every day | ORAL | Status: DC
Start: 2018-03-16 — End: 2018-03-19
  Administered 2018-03-16 – 2018-03-19 (×4): 10 mg via ORAL
  Filled 2018-03-16 (×4): qty 1

## 2018-03-16 MED ORDER — ENSURE ENLIVE PO LIQD
237.0000 mL | Freq: Every day | ORAL | Status: DC
  Administered 2018-03-16 – 2018-03-19 (×4): 237 mL via ORAL

## 2018-03-16 MED FILL — Lidocaine HCl Local Inj 1%: INTRAMUSCULAR | Qty: 20 | Status: AC

## 2018-03-16 MED FILL — Heparin Sodium (Porcine) 2 Unit/ML in Sodium Chloride 0.9%: INTRAMUSCULAR | Qty: 1000 | Status: AC

## 2018-03-16 NOTE — Clinical Social Work Note (Signed)
Clinical Social Work Assessment  Patient Details  Name: Mark PlummerOlin Lozano MRN: [id number] Date of Birth: 06/20/1966  Date of referral:  03/16/18               Reason for consult:  Housing Concerns/Homelessness                Permission sought to share information with:  Other Permission granted to share information::  Yes, Verbal Permission Granted  Name::        Agency::  [facility]; [facility]  Relationship::     Contact Information:     Housing/Transportation Living arrangements for the past 2 months:  Skilled Nursing Facility Source of Information:  Patient Patient Interpreter Needed:  None Criminal Activity/Legal Involvement Pertinent to Current Situation/Hospitalization:  No - Comment as needed Significant Relationships:  None Lives with:  Self Do you feel safe going back to the place where you live?  Yes Need for family participation in patient care:  No (Coment)  Care giving concerns:  [place] but reports being able to get what he needs as far as food, shelter, water, etc.  Pt does express difficulty getting medications due to financial concerns.   Social Worker assessment / plan:  CSW spoke with pt regarding housing concerns.  Pt reports that he has been homeless since last summer after he was released from jail (had been in jail for 30 months and was homeless prior to incarceration).  Stayed at a shelter after release from prison but found it difficult to stay in shelter around the crowds due to his PTSD.  CSW completed VISPAT assessment with patient and sent to [facility] to assess for case management services.  [facility] and will make referral for case management services for homeless individuals with persistent mental illness- awaiting return call.  Offered shelter resources.   Employment status:  Unemployed Health and safety inspectornsurance information:  Self Pay (Medicaid Pending) PT  Recommendations:  Not assessed at this time Information / Referral to community resources:  Shelter, Other (Comment Required)([facility]; [facility])  Patient/Family's Response to care:  Patient was very pleasant and agreeable to assessment for potential case management services- would love permanent housing if able.  Pt not agreeable to shelter resources- prefers staying outdoors due to his PTSD.  Patient/Family's Understanding of and Emotional Response to Diagnosis, Current Treatment, and Prognosis:  Pt seems to have good understanding of current situation and is open to whatever help is possible.  Emotional Assessment Appearance:  Appears stated age Attitude/Demeanor/Rapport:    Affect (typically observed):  Appropriate, Pleasant Orientation:  Oriented to Self, Oriented to Place, Oriented to  Time, Oriented to Situation Alcohol / Substance use:  Illicit Drugs Psych involvement (Current and /or in the community):  [facility]  Discharge Needs  Concerns to be addressed:  Homelessness Readmission within the last 30 days:  No Current discharge risk:  Homeless Barriers to Discharge:  Continued Medical Work up   Burna SisUris, Terrin Meddaugh H, LCSW 03/16/2018, 2:01 PM

## 2018-03-16 NOTE — Progress Notes (Signed)
Initial Nutrition Assessment  DOCUMENTATION CODES:   Not applicable  INTERVENTION:    Ensure Enlive po daily, each supplement provides 350 kcal and 20 grams of protein  NUTRITION DIAGNOSIS:   Increased nutrient needs related to acute illness as evidenced by estimated needs  GOAL:   Patient will meet greater than or equal to 90% of their needs   MONITOR:   PO intake, Supplement acceptance, Labs, Skin, Weight trends, I & O's  REASON FOR ASSESSMENT:   Malnutrition Screening Tool  ASSESSMENT:   52 yo Male with PMH significant cardiovascular history, states that he is homeless, has prior history of HTN per record evaluation, presented with chest pain that is ongoing for the past 7 days.  Pt receiving nursing care upon visit. Curtain closed. Did not disturb. Per Malnutrition Screening Tool Report, pt with recent weight loss without trying. Weight readings below reveal an 8% loss since 12/2017. Significant for time frame.  Noted pt is homeless. Question food insecurity. PO intake 100% per flowsheet records. Labs and medications reviewed.  NUTRITION - FOCUSED PHYSICAL EXAM:  Unable to complete at this time.  Diet Order:  Diet Heart Room service appropriate? Yes; Fluid consistency: Thin  EDUCATION NEEDS:   Not appropriate for education at this time  Skin:  Skin Assessment: Reviewed RN Assessment  Last BM:  4/12   Intake/Output Summary (Last 24 hours) at 03/16/2018 1645 Last data filed at 03/16/2018 1200 Gross per 24 hour  Intake 1705.03 ml  Output 500 ml  Net 1205.03 ml   Height:   Ht Readings from Last 1 Encounters:  03/15/18 5\' 7"  (1.702 m)   Weight:   Wt Readings from Last 1 Encounters:  03/15/18 170 lb (77.1 kg)   Wt Readings from Last 10 Encounters:  03/15/18 170 lb (77.1 kg)  12/20/17 185 lb 9 oz (84.2 kg)  04/12/12 177 lb 12.8 oz (80.6 kg)  10/14/11 176 lb 2.4 oz (79.9 kg)   Ideal Body Weight:  67.2 kg  BMI:  Body mass index is 26.63  kg/m.  Estimated Nutritional Needs:   Kcal:  2000-2200  Protein:  100-115 gm  Fluid:  2.0-2.2 L  Maureen ChattersKatie Adarius Tigges, RD, LDN Pager #: [phone] After-Hours Pager #: [phone]

## 2018-03-16 NOTE — Progress Notes (Signed)
CARDIAC REHAB PHASE I   PRE:  Rate/Rhythm: 63 SR  BP:  Supine:   Sitting: 119/84  Standing:    SaO2: 99%RA  MODE:  Ambulation: 370 ft   POST:  Rate/Rhythm: 66 SR  BP:  Supine:   Sitting: 109/72  Standing:    SaO2: 99%RA 1350-1450 Pt walked 370 ft on RA with steady gait and no CP. Tolerated well. Back to bed after walk. MI education completed with pt who voiced understanding. Stressed importance of brilinta with stent. Has seen case Production designer, theatre/television/filmmanager. Reviewed MI restrictions, NTG use, risk factors including no smoking, ex ed and gave heart healthy diet. Gave pt fake cigarette and discussed smoking cessation. Pt stated he has difficulty getting healthy foods. Encouraged him to try to get more vegetables in if he can. He stated he can get some healthier foods from churches that provide meals. Will refer to [name] but at this time pt has no phone or address. Left brochure for him to contact us if he is able to do program in future.   Mark Nuttingharlene Riannah Stagner, RN BSN  03/16/2018 2:44 PM

## 2018-03-16 NOTE — Plan of Care (Signed)
  Problem: Education: Goal: Knowledge of General Education information will improve Outcome: Progressing   Problem: Health Behavior/Discharge Planning: Goal: Ability to manage health-related needs will improve Outcome: Progressing   Problem: Clinical Measurements: Goal: Ability to maintain clinical measurements within normal limits will improve Outcome: Progressing Goal: Will remain free from infection Outcome: Progressing Goal: Diagnostic test results will improve Outcome: Progressing Goal: Respiratory complications will improve Outcome: Progressing Goal: Cardiovascular complication will be avoided Outcome: Progressing   Problem: Nutrition: Goal: Adequate nutrition will be maintained Outcome: Progressing   Problem: Elimination: Goal: Will not experience complications related to bowel motility Outcome: Progressing Goal: Will not experience complications related to urinary retention Outcome: Progressing   Problem: Pain Managment: Goal: General experience of comfort will improve Outcome: Progressing   Problem: Safety: Goal: Ability to remain free from injury will improve Outcome: Progressing   Problem: Skin Integrity: Goal: Risk for impaired skin integrity will decrease Outcome: Progressing   Problem: Education: Goal: Understanding of cardiac disease, CV risk reduction, and recovery process will improve Outcome: Progressing Goal: Understanding of medication regimen will improve Outcome: Progressing   Problem: Activity: Goal: Ability to tolerate increased activity will improve Outcome: Progressing   Problem: Cardiac: Goal: Ability to achieve and maintain adequate cardiopulmonary perfusion will improve Outcome: Progressing   Problem: Health Behavior/Discharge Planning: Goal: Ability to safely manage health-related needs after discharge will improve Outcome: Progressing   Problem: Coping: Goal: Level of anxiety will decrease Outcome: Completed/Met   Problem:  Cardiac: Goal: Vascular access site(s) Level 0-1 will be maintained Outcome: Completed/Met

## 2018-03-16 NOTE — Progress Notes (Signed)
Subjective:   Patient does not have any chest pain or shortness of breath this morning.   Objective:  Vital Signs in the last 24 hours: Temp:  [98.4 F (36.9 C)-99.2 F (37.3 C)] 98.4 F (36.9 C) (04/12 0327) Pulse Rate:  [47-80] 59 (04/12 0800) Resp:  [0-35] 24 (04/12 0800) BP: (99-188)/(50-159) 137/96 (04/12 0800) SpO2:  [96 %-100 %] 99 % (04/12 0800) Weight:  [77.1 kg (170 lb)] 77.1 kg (170 lb) (04/11 1537)  Intake/Output from previous day: 04/11 0701 - 04/12 0700 In: 1285 [P.O.:480; I.V.:805] Out: 300 [Urine:300] Intake/Output from this shift: No intake/output data recorded.  Physical Exam: Physical Exam  Constitutional: He is oriented to person, place, and time. He appears well-developed and well-nourished.  HENT:  Head: Normocephalic and atraumatic.  Eyes: Pupils are equal, round, and reactive to light. Conjunctivae are normal.  Neck: Normal range of motion. Neck supple. No JVD present.  Cardiovascular: Normal rate and regular rhythm.  Murmur heard. Right radial site with no bleeding, hematoma. Good capillary refill.    Pulmonary/Chest: Effort normal and breath sounds normal. No respiratory distress. He has no wheezes.  Abdominal: Soft. There is no tenderness.  Musculoskeletal: Normal range of motion. He exhibits no edema.  Lymphadenopathy:    He has no cervical adenopathy.  Neurological: He is alert and oriented to person, place, and time.  Skin: Skin is warm and dry.  Nursing note and vitals reviewed.  Study Conclusions  - Left ventricle: The cavity size was normal. Wall thickness was   increased in a pattern of moderate LVH. Systolic function was   normal. The estimated ejection fraction was in the range of 50%   to 55%. There is hypokinesis of the basallateral and   inferolateral myocardium. Features are consistent with a   pseudonormal left ventricular filling pattern, with concomitant   abnormal relaxation and increased filling pressure (grade 2    diastolic dysfunction). - Aortic root: The aortic root was mildly dilated. - Mitral valve: There was mild regurgitation. - Left atrium: The atrium was moderately dilated.  Impressions:  - Hyokinesis of the basal inferolateral/lateral walls with overall   preserved LV systolic function; moderate LVH; myocardium with   speckled appearance; consider further evaluation to exclude   amyloid; moderate diastolic dysfunction; mildly dilated aortic   root; mild MR; moderate LAE; mild TR; possible patent forament   ovale noted on subcostal views.  Lab Results: Recent Labs    03/15/18 1553 03/15/18 1644 03/16/18 0629  WBC 5.5  --  7.1  HGB 14.9 13.6 12.9*  PLT 212  --  196   Recent Labs    03/15/18 1553 03/15/18 1644 03/16/18 0629  NA 140 141 138  K 3.7 3.7 3.8  CL 102 103 107  CO2 24  --  21*  GLUCOSE 102* 108* 103*  BUN 8 10 11   CREATININE 1.17 1.00 1.01   Recent Labs    03/16/18 0044 03/16/18 0629  TROPONINI >65.00* >65.00*   Hepatic Function Panel Recent Labs    03/15/18 1553  PROT 7.3  ALBUMIN 3.8  AST 40  ALT 39  ALKPHOS 65  BILITOT 0.8   Recent Labs    03/15/18 1554  CHOL 168     Cardiac Studies:  Assessment:  52 y/o African American male with posterior STEMI 03/16/2018:  STEMI: Culprit artery  LCx/OM successful PCI and kissing balloon angioplasty to LCx/OM Hypertension Moderate LVH, likely to be related to uncontrolled hypertension H/o suicidal ideation PTSD Homelessness  Plan: Continue ASA 81 mg, brilinta 90 mg bid, ideally for 1 year Continue coreg 6.25 mg bid, added lisinopril 10 mg daily Biggest challenge is ensuring safety of the patient, as he is homeless, sleeps on the street. He endorsed using cocaine the day before his MI. I am very concerned about his compliance. Appreciate help from social work/case management.     LOS: 1 day    Manish J Patwardhan 03/16/2018, 8:48 AM  Manish Emiliano DyerJ Patwardhan, MD [facility].  [place] Pager: [phone] Office: [phone] If no answer Cell [phone]

## 2018-03-16 NOTE — Care Management Note (Signed)
Case Management Note  Patient Details  Name: Mark Lozano MRN: [id number] Date of Birth: 11/11/1966  Subjective/Objective:                 Spoke w patient at bedside. He states he is homeless. Usually stays outside around [address]. [facility] Follows w [name] NP [phone]. Admitted w MI, states he was using cocaine. Has been started on Brilinta. Will receive 30 day free card. Made [name] NP aware of admission and medication needs post DC, she will follow up and help w assistance post DC.    Action/Plan:   Expected Discharge Date:                  Expected Discharge Plan:  Home/Self Care  In-House Referral:  Clinical Social Work  Discharge planning Services  CM Consult  Post Acute Care Choice:    Choice offered to:     DME Arranged:    DME Agency:     HH Arranged:    HH Agency:     Status of Service:  In process, will continue to follow  If discussed at Long Length of Stay Meetings, dates discussed:    Additional Comments:  Lawerance SabalDebbie Karmela Bram, RN 03/16/2018, 11:20 AM

## 2018-03-16 NOTE — Progress Notes (Signed)
  Echocardiogram 2D Echocardiogram has been performed.  Ara Mano G Sallyanne Birkhead 03/16/2018, 8:55 AM

## 2018-03-17 NOTE — Progress Notes (Addendum)
CARDIAC REHAB PHASE I   PRE:  Rate/Rhythm: 64 SR  BP:   Sitting: 143/103  Standing: 154/87      SaO2: 98%RA  MODE:  Ambulation: 370 ft   POST:  Rate/Rhythm: 68 SR  BP:   Sitting: 130/83     SaO2: 99% RA 1315-1334  Pt ambulated 35670ft fairly independently. Pt maintained a somewhat steady gait and pace. Pt walked without sign/symptoms of SOB, dizziness, CP or fatigue. Returned pt to bedside with VSS. Pt is still waiting on lunch tray. Encouraged pt to walk another bout with nursing staff later today, and encouraged 3 walking bouts on Sunday. Pt voiced understanding.   Mark Lozano D Zaynab Chipman,MS,ACSM-RCEP 03/17/2018 1:34 PM

## 2018-03-17 NOTE — Progress Notes (Signed)
Report given to CordeleKristen, Charity fundraiserN. Pt transported to 4E15. No s/s of any acute distress.

## 2018-03-18 NOTE — Progress Notes (Signed)
Patient ambulated in hallway x1 assist with nursing staff. No complaints of chest pain back in bed will monitor patient. Steph Cheadle, Randall AnKristin Jessup RN

## 2018-03-18 NOTE — Progress Notes (Signed)
Subjective:   Asymptomatic  Objective:  Vital Signs in the last 24 hours: Temp:  [97.7 F (36.5 C)-99 F (37.2 C)] 97.7 F (36.5 C) (04/14 1140) Pulse Rate:  [58-71] 62 (04/14 0855) Resp:  [13-27] 22 (04/14 1140) BP: (126-164)/(75-113) 132/76 (04/14 1140) SpO2:  [93 %-99 %] 98 % (04/14 1140)  Intake/Output from previous day: 04/13 0701 - 04/14 0700 In: 360 [P.O.:360] Out: 1075 [Urine:1075] Blood pressure 132/76, pulse 62, temperature 97.7 F (36.5 C), temperature source Oral, resp. rate (!) 22, height 5\' 7"  (1.702 m), weight 73.8 kg (162 lb 11.2 oz), SpO2 98 %.   Physical Exam: Physical Exam  Constitutional: He is oriented to person, place, and time and well-developed, well-nourished, and in no distress. No distress.  Eyes: Conjunctivae are normal.  Neck: Normal range of motion. Neck supple.  Cardiovascular: Normal rate and regular rhythm. Exam reveals no gallop and no friction rub.  No murmur heard. Pulmonary/Chest: Effort normal and breath sounds normal.  Abdominal: Soft. Bowel sounds are normal.  Musculoskeletal: Normal range of motion.  Neurological: He is alert and oriented to person, place, and time.  Skin: Skin is warm and dry.  Psychiatric: Affect normal.    Lab Results: BMP Recent Labs    07/24/17 1130 03/15/18 1553 03/15/18 1644 03/16/18 0629  NA 136 140 141 138  K 3.9 3.7 3.7 3.8  CL 105 102 103 107  CO2 23 24  --  21*  GLUCOSE 113* 102* 108* 103*  BUN 11 8 10 11   CREATININE 0.82 1.17 1.00 1.01  CALCIUM 9.6 9.6  --  8.8*  GFRNONAA >60 >60  --  >60  GFRAA >60 >60  --  >60    CBC Recent Labs  Lab 03/15/18 1553  03/16/18 0629  WBC 5.5  --  7.1  RBC 4.54  --  3.99*  HGB 14.9   < > 12.9*  HCT 43.0   < > 37.6*  PLT 212  --  196  MCV 94.7  --  94.2  MCH 32.8  --  32.3  MCHC 34.7  --  34.3  RDW 13.7  --  13.7  LYMPHSABS 1.6  --   --   MONOABS 0.5  --   --   EOSABS 0.1  --   --   BASOSABS 0.0  --   --    < > = values in this interval not  displayed.   Cardiac Panel (last 3 results) Recent Labs    03/15/18 2055 03/16/18 0044 03/16/18 0629  TROPONINI >65.00* >65.00* >65.00*    TSH Recent Labs    03/15/18 1950  TSH 0.467    Lipid Panel     Component Value Date/Time   CHOL 168 03/15/2018 1554   TRIG 74 03/15/2018 1554   HDL 62 03/15/2018 1554   CHOLHDL 2.7 03/15/2018 1554   VLDL 15 03/15/2018 1554   LDLCALC 91 03/15/2018 1554     Hepatic Function Panel Recent Labs    07/24/17 1130 03/15/18 1553  PROT 8.1 7.3  ALBUMIN 4.2 3.8  AST 24 40  ALT 22 39  ALKPHOS 70 65  BILITOT 0.5 0.8   Cardiac Studies:  EKG: 03/17/2018: Normal sinus rhythm at rate of 58 bpm, normal axis.  Nonspecific inferior T wave abnormality.  Early repolarization.  ECHO: 03/16/2018: Normal LVEF, 50-55%.  Inferolateral and basal lateral hypokinesis.  Grade 2 diastolic dysfunction.  Mild aortic root dilatation.  Mild MR and moderate left atrial enlargement.  Scheduled  Meds: . aspirin EC  81 mg Oral Daily  . atorvastatin  80 mg Oral q1800  . carvedilol  6.25 mg Oral BID WC  . feeding supplement (ENSURE ENLIVE)  237 mL Oral Daily  . lisinopril  10 mg Oral Daily  . sodium chloride flush  3 mL Intravenous Q12H  . ticagrelor  90 mg Oral BID   Continuous Infusions: . sodium chloride Stopped (03/15/18 2100)  . sodium chloride     PRN Meds:.sodium chloride, acetaminophen, nitroGLYCERIN, sodium chloride flush   Assessment/Plan:  1.  Acute posterior wall myocardial infarction S/P PTCA and stenting of circumflex coronary artery on 03/15/2018 with implantation of 3.0 x 18 mm resolute Onyx DES and large OM1 sidebranch balloon angioplasty for stent jailing 2.  Substance abuse with cocaine and tobacco 3.  Hypertension  Recommendation: I had a long discussion with the patient to make lifestyle and therapeutic changes.  Is presently doing well, blood pressure is well controlled, remains asymptomatic.  We are awaiting placement.  Smoking  cessation has been discussed again and again I have reiterated abstaining from cocaine use.  Previously he was well employed but had to lose the job as he had relapse of cocaine use for the second time.  I have encouraged him to make another attempt to obtaining his job back.  Yates DecampJay Curlie Sittner, M.D. 03/18/2018, 1:06 PM [facility] Pager: [phone] Office: [phone] If no answer: [phone]

## 2018-03-18 NOTE — Progress Notes (Signed)
Late entry from 03/17/2018  Subjective:   Asymptomatic  Objective:  Vital Signs in the last 24 hours: Temp:  [97.7 F (36.5 C)-99 F (37.2 C)] 97.7 F (36.5 C) (04/14 1140) Pulse Rate:  [58-71] 62 (04/14 0855) Resp:  [13-27] 22 (04/14 1140) BP: (126-164)/(75-113) 132/76 (04/14 1140) SpO2:  [93 %-99 %] 98 % (04/14 1140)  Intake/Output from previous day: 04/13 0701 - 04/14 0700 In: 360 [P.O.:360] Out: 1075 [Urine:1075]  Physical Exam: Physical Exam  Constitutional: He is oriented to person, place, and time and well-developed, well-nourished, and in no distress. No distress.  Eyes: Conjunctivae are normal.  Neck: Normal range of motion. Neck supple.  Cardiovascular: Normal rate and regular rhythm. Exam reveals no gallop and no friction rub.  No murmur heard. Pulmonary/Chest: Effort normal and breath sounds normal.  Abdominal: Soft. Bowel sounds are normal.  Musculoskeletal: Normal range of motion.  Neurological: He is alert and oriented to person, place, and time.  Skin: Skin is warm and dry.  Psychiatric: Affect normal.    Lab Results: BMP Recent Labs    07/24/17 1130 03/15/18 1553 03/15/18 1644 03/16/18 0629  NA 136 140 141 138  K 3.9 3.7 3.7 3.8  CL 105 102 103 107  CO2 23 24  --  21*  GLUCOSE 113* 102* 108* 103*  BUN 11 8 10 11   CREATININE 0.82 1.17 1.00 1.01  CALCIUM 9.6 9.6  --  8.8*  GFRNONAA >60 >60  --  >60  GFRAA >60 >60  --  >60    CBC Recent Labs  Lab 03/15/18 1553  03/16/18 0629  WBC 5.5  --  7.1  RBC 4.54  --  3.99*  HGB 14.9   < > 12.9*  HCT 43.0   < > 37.6*  PLT 212  --  196  MCV 94.7  --  94.2  MCH 32.8  --  32.3  MCHC 34.7  --  34.3  RDW 13.7  --  13.7  LYMPHSABS 1.6  --   --   MONOABS 0.5  --   --   EOSABS 0.1  --   --   BASOSABS 0.0  --   --    < > = values in this interval not displayed.   Cardiac Panel (last 3 results) Recent Labs    03/15/18 2055 03/16/18 0044 03/16/18 0629  TROPONINI >65.00* >65.00* >65.00*     TSH Recent Labs    03/15/18 1950  TSH 0.467    Lipid Panel     Component Value Date/Time   CHOL 168 03/15/2018 1554   TRIG 74 03/15/2018 1554   HDL 62 03/15/2018 1554   CHOLHDL 2.7 03/15/2018 1554   VLDL 15 03/15/2018 1554   LDLCALC 91 03/15/2018 1554     Hepatic Function Panel Recent Labs    07/24/17 1130 03/15/18 1553  PROT 8.1 7.3  ALBUMIN 4.2 3.8  AST 24 40  ALT 22 39  ALKPHOS 70 65  BILITOT 0.5 0.8   Cardiac Studies:  EKG: 03/17/2018: Normal sinus rhythm at rate of 58 bpm, normal axis.  Nonspecific inferior T wave abnormality.  Early repolarization.  ECHO: 03/16/2018: Normal LVEF, 50-55%.  Inferolateral and basal lateral hypokinesis.  Grade 2 diastolic dysfunction.  Mild aortic root dilatation.  Mild MR and moderate left atrial enlargement.  Assessment/Plan:  1.  Acute posterior wall myocardial infarction S/P PTCA and stenting of circumflex coronary artery on 03/15/2018 with implantation of 3.0 x 18 mm resolute Onyx DES  and large OM1 sidebranch balloon angioplasty for stent jailing 2.  Substance abuse with cocaine and tobacco 3.  Hypertension  Recommendation: I had a long discussion with the patient to make lifestyle and therapeutic changes.  Is presently doing well, blood pressure is well controlled, remains asymptomatic.  We are awaiting placement.  He can be transferred to telemetry.  This note is a late entry from  03/17/2018.  Yates Decamp, M.D. 03/18/2018, 1:00 PM [facility] Pager: [phone] Office: [phone] If no answer: [phone]

## 2018-03-18 NOTE — Progress Notes (Signed)
Patient ambulated in hallway with nursing staff. Psalm Schappell Jessup RN  

## 2018-03-19 MED ORDER — ASPIRIN 81 MG PO TBEC: 81.0000 mg | DELAYED_RELEASE_TABLET | Freq: Every day | ORAL | 6 refills | Status: AC

## 2018-03-19 MED ORDER — LISINOPRIL 20 MG PO TABS: 20.0000 mg | ORAL_TABLET | Freq: Every day | ORAL | 6 refills | Status: AC

## 2018-03-19 MED ORDER — LISINOPRIL 10 MG PO TABS
10.0000 mg | ORAL_TABLET | Freq: Every day | ORAL | Status: AC
  Administered 2018-03-19: 10 mg via ORAL
  Filled 2018-03-19: qty 1

## 2018-03-19 MED ORDER — LISINOPRIL 10 MG PO TABS: 20.0000 mg | ORAL_TABLET | Freq: Every day | ORAL | Status: DC

## 2018-03-19 MED ORDER — TICAGRELOR 90 MG PO TABS: 90.0000 mg | ORAL_TABLET | Freq: Two times a day (BID) | ORAL | 6 refills | Status: AC

## 2018-03-19 MED ORDER — ATORVASTATIN CALCIUM 80 MG PO TABS: 80.0000 mg | ORAL_TABLET | Freq: Every day | ORAL | 6 refills | Status: AC

## 2018-03-19 MED ORDER — NITROGLYCERIN 0.4 MG SL SUBL: 0.4000 mg | SUBLINGUAL_TABLET | SUBLINGUAL | 3 refills | Status: AC | PRN

## 2018-03-19 MED ORDER — AMLODIPINE BESYLATE 5 MG PO TABS: 5.0000 mg | ORAL_TABLET | Freq: Every day | ORAL | 6 refills | Status: AC

## 2018-03-19 MED FILL — NITROGLYCERIN 0.4 MG TAB SL: 0.4 | 10 days supply | Qty: 25 | Fill #0

## 2018-03-19 MED FILL — LISINOPRIL 20 MG TABLET: 20 | 30 days supply | Qty: 30 | Fill #0

## 2018-03-19 MED FILL — AMLODIPINE BESYLATE 5 MG TA: 5 | 30 days supply | Qty: 30 | Fill #0

## 2018-03-19 MED FILL — ATORVASTATIN 80 MG TABLET: 80 | 30 days supply | Qty: 30 | Fill #0

## 2018-03-19 MED FILL — BRILINTA 90 MG TABLET: 90 | 30 days supply | Qty: 60 | Fill #0

## 2018-03-19 NOTE — Progress Notes (Signed)
Clinical Social Worker following patient for support. CSW met patent at bedside to assist with disposition plan. Patient stated he does not want to go to a shelter due to his PTSD. Patient stated he will go back to where he was staying. Patient stated he is aware of his resources and will follow up with resources if needed. CSW gave patient bus passes and resources for substance abuse to assist with his cocaine use. RNCM has assisted patient with medications needs. CSW signing off as social work needs have been met.   Rhea Pink, MSW,  [place]

## 2018-03-19 NOTE — Progress Notes (Addendum)
Patient given discharge instructions, AVS, medication list and medications from pharmacy  sent with patient (RN case manager assisted with obtaining medications). RN reviewed medication list with patient medication bottles, explained that he would need to purchase asprin as it can be purchased with out a prescription.pt verbalized understanding.  Patient also given follow up appointments and verbalized understanding. IV and tele dcd will discharge home as ordered . Neel Buffone, Randall AnKristin Jessup RN

## 2018-03-19 NOTE — Progress Notes (Signed)
CARDIAC REHAB PHASE I   PRE:  Rate/Rhythm: 70 SR  BP:  Supine: 133/97  Sitting:   Standing:    SaO2: 100%RA  MODE:  Ambulation: 1100 ft   POST:  Rate/Rhythm: 70 SR  BP:  Supine: 143/92  Sitting:   Standing:    SaO2: 99%RA [phone] Pt walked 1100 ft independently with steady gait and no CP. Tolerated well. Reinforced importance of brilinta. No questions re ed done Friday. Encouraged walks independently.   Luetta Nuttingharlene Johnnie Moten, RN BSN  03/19/2018 8:44 AM

## 2018-03-19 NOTE — Progress Notes (Deleted)
Subjective:   Patient does not have any chest pain or shortness of breath this morning.   Objective:  Vital Signs in the last 24 hours: Temp:  [97.7 F (36.5 C)-98.2 F (36.8 C)] 98.2 F (36.8 C) (04/15 0440) Pulse Rate:  [58-61] 60 (04/15 0808) Resp:  [16-26] 23 (04/15 0805) BP: (132-146)/(76-97) 146/93 (04/15 0805) SpO2:  [94 %-98 %] 95 % (04/15 0805)  Intake/Output from previous day: 04/14 0701 - 04/15 0700 In: 120 [P.O.:120] Out: -  Intake/Output from this shift: Total I/O In: 120 [P.O.:120] Out: -   Physical Exam: Physical Exam  Constitutional: He is oriented to person, place, and time. He appears well-developed and well-nourished.  HENT:  Head: Normocephalic and atraumatic.  Eyes: Pupils are equal, round, and reactive to light. Conjunctivae are normal.  Neck: Normal range of motion. Neck supple. No JVD present.  Cardiovascular: Normal rate and regular rhythm.  Murmur heard. Right radial site with no bleeding, hematoma. Good capillary refill.    Pulmonary/Chest: Effort normal and breath sounds normal. No respiratory distress. He has no wheezes.  Abdominal: Soft. There is no tenderness.  Musculoskeletal: Normal range of motion. He exhibits no edema.  Lymphadenopathy:    He has no cervical adenopathy.  Neurological: He is alert and oriented to person, place, and time.  Skin: Skin is warm and dry.  Nursing note and vitals reviewed.  Study Conclusions  - Left ventricle: The cavity size was normal. Wall thickness was   increased in a pattern of moderate LVH. Systolic function was   normal. The estimated ejection fraction was in the range of 50%   to 55%. There is hypokinesis of the basallateral and   inferolateral myocardium. Features are consistent with a   pseudonormal left ventricular filling pattern, with concomitant   abnormal relaxation and increased filling pressure (grade 2   diastolic dysfunction). - Aortic root: The aortic root was mildly dilated. -  Mitral valve: There was mild regurgitation. - Left atrium: The atrium was moderately dilated.  Impressions:  - Hyokinesis of the basal inferolateral/lateral walls with overall   preserved LV systolic function; moderate LVH; myocardium with   speckled appearance; consider further evaluation to exclude   amyloid; moderate diastolic dysfunction; mildly dilated aortic   root; mild MR; moderate LAE; mild TR; possible patent forament   ovale noted on subcostal views.   Cardiac Studies:  Assessment:  52 y/o [place] with posterior STEMI 03/16/2018:  STEMI: Culprit artery  LCx/OM successful PCI and kissing balloon angioplasty to LCx/OM Hypertension: Suboptimal Moderate LVH, likely to be related to uncontrolled hypertension H/o suicidal ideation PTSD Cocaine abuse Homelessness  Plan: Continue ASA 81 mg, brilinta 90 mg bid, ideally for 1 year Continue coreg 6.25 mg bid, increase lisinopril to 20 mg daily  Patient worked at [employer] until Dec 2018, quit due to his cocaine abuse habit. He is divorced, nearest family members are brother each in [place] and [place]. He does not have insurance. He is willing to quit cocaine, but clearly has significant risk of relapse. I have spoken with Case Manager Donn PieriniKristi Webster and Social worked [employer]. Both are trying to look into options for medications, as well as placement, Unfortunately, options are limited. Will await further information. Cancel cardiac monitoring for rest of the hospital stay. Labs limited to BMP on 04/17- if patient still here, given increase in lisinopril today.    LOS: 4 days    Shaunie Boehm J Tyshauna Finkbiner 03/19/2018, 9:03 AM  Maeby Vankleeck  Esther Hardy, MD [facility] [place] Pager: [phone] Office: [phone] If no answer Cell [phone]

## 2018-03-19 NOTE — Care Management Note (Signed)
Case Management Note Original Note Created Lawerance SabalDebbie Swist, RN 03/16/2018, 11:20 AM   Patient Details  Name: Mark PlummerOlin Lozano MRN: [id number] Date of Birth: 05/11/1966  Subjective/Objective:                 Spoke w patient at bedside. He states he is homeless. Usually stays outside around [address]. [facility] Follows w Mark AbrahamsMary Ann Placey NP [phone]. Admitted w MI, states he was using cocaine. Has been started on Brilinta. Will receive 30 day free card. Made Mark SharpsMary Ann Placey NP aware of admission and medication needs post DC, she will follow up and help w assistance post DC.    Action/Plan:   Expected Discharge Date:  03/19/18               Expected Discharge Plan:  Homeless Shelter  In-House Referral:  Clinical Social Work  Discharge planning Services  CM Consult, Medication Assistance, MATCH Program  Post Acute Care Choice:  NA Choice offered to:  NA  DME Arranged:    DME Agency:     HH Arranged:    HH Agency:     Status of Service:  Completed, signed off  If discussed at [employer] Meetings, dates discussed:    Discharge Disposition: homeless   Additional Comments:  03/19/18- 1130- Mark PieriniKristi Paiden Cavell RN, CM- pt for d/c today- have spoken with MD regarding transition of care needs and medications for discharge- CM [facility] and [facility] costs [facility] so that pt has meds in hand on discharge including Brilinta with use of 30 day free card- spoke with pt at bedside- pt verbalizes understanding of MATCH assistance and states that he is aware of need to f/u as soon as possible with [facility]. Pharmacist with assist and provide pt with meds at the bedside prior to discharge. Pt has been seen by CSW and has been provided with needed resources.   Mark PieriniWebster, Mark Lozano [place], RN 03/19/2018, 12:32 PM [phone] 4E Transition Care Coordinator

## 2018-03-19 NOTE — Discharge Summary (Signed)
Physician Discharge Summary  Patient ID: Mark Lozano MRN: [id number] DOB/AGE: 05-09-1966 52 y.o.  Admit date: 03/15/2018 Discharge date: 03/19/2018  Admission Diagnoses:  Discharge Diagnoses:  Principal Problem:   Acute MI, true posterior wall, initial episode of care [facility]) Active Problems:   Posterior MI (HCC) CAD Hypertension Cocaine abuse PTSD  Discharged Condition: Good  Hospital Course:   52 year old African-American male with hypertension, PTSD, cocaine abuse, admitted with chest pain and posterior MI.  He underwent successful primary PCI to left circumflex with 2 overlapping stents and kissing balloon angioplasty to marginal branch.  It was later discovered that patient is homeless and has cocaine abuse history.  Case management and social worker had to find resources for the patient and also provide free medications through: Outpatient pharmacy.  He will be discharged on aspirin, Brilinta, atorvastatin, lisinopril, and amlodipine.  Beta blocker was discontinued due to concern regarding cocaine abuse. I will see for follow-up on 03/26/2018.  Consults:  Case management Social work  Significant Diagnostic Studies: Results for Mark Lozano, Mark Lozano (MRN [id number]) as of 03/19/2018 10:44  Ref. Range 03/16/2018 06:29  WBC Latest Ref Range: 4.0 - 10.5 K/uL 7.1  RBC Latest Ref Range: 4.22 - 5.81 MIL/uL 3.99 (L)  Hemoglobin Latest Ref Range: 13.0 - 17.0 g/dL 78.2 (L)  HCT Latest Ref Range: 39.0 - 52.0 % 37.6 (L)  MCV Latest Ref Range: 78.0 - 100.0 fL 94.2  MCH Latest Ref Range: 26.0 - 34.0 pg 32.3  MCHC Latest Ref Range: 30.0 - 36.0 g/dL 95.6  RDW Latest Ref Range: 11.5 - 15.5 % 13.7  Platelets Latest Ref Range: 150 - 400 K/uL 196   Results for Mark Lozano, Mark Lozano (MRN [id number]) as of 03/19/2018 10:44  Ref. Range 03/16/2018 06:29  BASIC METABOLIC PANEL Unknown Rpt (A)  Sodium Latest Ref Range: 135 - 145 mmol/L 138  Potassium Latest Ref Range: 3.5 - 5.1 mmol/L 3.8  Chloride Latest Ref Range: 101 -  111 mmol/L 107  CO2 Latest Ref Range: 22 - 32 mmol/L 21 (L)  Glucose Latest Ref Range: 65 - 99 mg/dL 469 (H)  BUN Latest Ref Range: 6 - 20 mg/dL 11  Creatinine Latest Ref Range: 0.61 - 1.24 mg/dL 6.29  Calcium Latest Ref Range: 8.9 - 10.3 mg/dL 8.8 (L)  Anion gap Latest Ref Range: 5 - 15  10  GFR, Est Non African American Latest Ref Range: >60 mL/min >60  GFR, Est African American Latest Ref Range: >60 mL/min >60  Troponin I Latest Ref Range: <0.03 ng/mL >65.00 (HH)   Echocardiogram 03/16/2018: Study Conclusions  - Left ventricle: The cavity size was normal. Wall thickness was   increased in a pattern of moderate LVH. Systolic function was   normal. The estimated ejection fraction was in the range of 50%   to 55%. There is hypokinesis of the basallateral and   inferolateral myocardium. Features are consistent with a   pseudonormal left ventricular filling pattern, with concomitant   abnormal relaxation and increased filling pressure (grade 2   diastolic dysfunction). - Aortic root: The aortic root was mildly dilated. - Mitral valve: There was mild regurgitation. - Left atrium: The atrium was moderately dilated.  Impressions:  - Hyokinesis of the basal inferolateral/lateral walls with overall   preserved LV systolic function; moderate LVH; myocardium with   speckled appearance; consider further evaluation to exclude   amyloid; moderate diastolic dysfunction; mildly dilated aortic   root; mild MR; moderate LAE; mild TR; possible patent forament   ovale noted  on subcostal views.   Treatments:  Coronary angiogram 03/15/2018: LV gram: Mild inferoposterior hypokinesis.  LVEF 55%.  Mild disease in the RCA which is dominant.  Tortuous LAD.  Mild luminal irregularity.  Circumflex occluded in the proximal segment, S/P 3.0 x 18 mm resolute Onyx DES into the AV groove branch followed by kissing balloon angioplasty of a large OM-1 which was stent jailed initially with 3 mm balloon followed  by kissing balloon with 2.5 mm into the AV groove branch and OM1, stenosis reduced from 100% to 0% with TIMI 0 to TIMI III flow at the end of the procedure.    Discharge Exam: Blood pressure (!) 146/93, pulse 60, temperature 98.2 F (36.8 C), temperature source Oral, resp. rate (!) 23, height 5\' 7"  (1.702 m), weight 73.8 kg (162 lb 11.2 oz), SpO2 95 %. Constitutional: He is oriented to person, place, and time. He appears well-developed and well-nourished.  HENT:  Head: Normocephalic and atraumatic.  Eyes: Pupils are equal, round, and reactive to light. Conjunctivae are normal.  Neck: Normal range of motion. Neck supple. No JVD present.  Cardiovascular: Normal rate and regular rhythm.  Murmur heard. Right radial site with no bleeding, hematoma. Good capillary refill.    Pulmonary/Chest: Effort normal and breath sounds normal. No respiratory distress. He has no wheezes.  Abdominal: Soft. There is no tenderness.  Musculoskeletal: Normal range of motion. He exhibits no edema.  Lymphadenopathy:    He has no cervical adenopathy.  Neurological: He is alert and oriented to person, place, and time.  Skin: Skin is warm and dry.  Nursing note and vitals reviewed.      Disposition: Discharge disposition: 01-Home or Self Care       Discharge Instructions    Amb Referral to Cardiac Rehabilitation   Complete by:  As directed    Diagnosis:   STEMI Coronary Stents     Diet - low sodium heart healthy   Complete by:  As directed    Increase activity slowly   Complete by:  As directed      Allergies as of 03/19/2018   No Known Allergies     Medication List    STOP taking these medications   cloNIDine 0.1 MG tablet Commonly known as:  CATAPRES   hydrALAZINE 25 MG tablet Commonly known as:  APRESOLINE   hydrochlorothiazide 25 MG tablet Commonly known as:  HYDRODIURIL   ibuprofen 200 MG tablet Commonly known as:  ADVIL,MOTRIN     TAKE these medications   amLODipine 5 MG  tablet Commonly known as:  NORVASC Take 1 tablet (5 mg total) by mouth daily.   aspirin 81 MG EC tablet Take 1 tablet (81 mg total) by mouth daily. Start taking on:  03/20/2018   atorvastatin 80 MG tablet Commonly known as:  LIPITOR Take 1 tablet (80 mg total) by mouth daily at 6 PM.   lisinopril 20 MG tablet Commonly known as:  PRINIVIL,ZESTRIL Take 1 tablet (20 mg total) by mouth daily. Start taking on:  03/20/2018   nitroGLYCERIN 0.4 MG SL tablet Commonly known as:  NITROSTAT Place 1 tablet (0.4 mg total) under the tongue every 5 (five) minutes x 3 doses as needed for chest pain.   ticagrelor 90 MG Tabs tablet Commonly known as:  BRILINTA Take 1 tablet (90 mg total) by mouth 2 (two) times daily.      Follow-up Information    Elder Negus, MD Follow up on 03/26/2018.   Specialty:  Cardiology  Why:  3:00 PM Contact information: [address] [place] [address] [phone]        [facility]   Contact information: [address] [place] [address] [phone]        Lavinia SharpsPlacey, Mary Ann, NP. Go to.   Why:  Chales AbrahamsMary Ann will assist with Brilinta, you need to see her as soon as you can after DC. Use your 30 day free coupon immediately upon DC to fill your Brilinta.  Contact information: [address] [place] [address] [phone]           Signed: Elder NegusManish J Linh Hedberg 03/19/2018, 10:40 AM  Elder NegusManish J Mycah Mcdougall, MD [facility] Pager: [phone] Office: [phone] If no answer Cell [phone]

## 2018-03-19 NOTE — Progress Notes (Signed)
Patient ambulated in hallway independently. Corde Antonini Jessup RN  

## 2018-03-21 ENCOUNTER — Telehealth (HOSPITAL_COMMUNITY): Payer: Self-pay

## 2018-03-21 NOTE — Telephone Encounter (Signed)
Per Phase I, do not call patient. Patient has no phone number and is homeless. Closed referral.

## 2018-06-10 IMAGING — CT CT ANGIO CHEST-ABD-PELV FOR DISSECTION W/ AND WO/W CM
2 of 7 series · 11 of 36 positions shown, 17 images · IV contrast (ISOVUE 370)
Comparison: None.

CLINICAL DATA: 51-year-old male with acute severe right chest and
abdominal pain.

EXAM:
CT ANGIOGRAPHY CHEST, ABDOMEN AND PELVIS
TECHNIQUE: Multidetector CT imaging through the chest, abdomen and pelvis was
performed using the standard protocol during bolus administration of
intravenous contrast. Multiplanar reconstructed images and MIPs were
obtained and reviewed to evaluate the vascular anatomy.
CONTRAST:  100 cc intravenous Isovue 370

[Series 5: arterial 3.0 b30f · axial · arterial · 0.81mm/px · z∈[-615,-93]mm · 10 of 202 slices shown, 15 images]
[im 14/202  mediastinal]
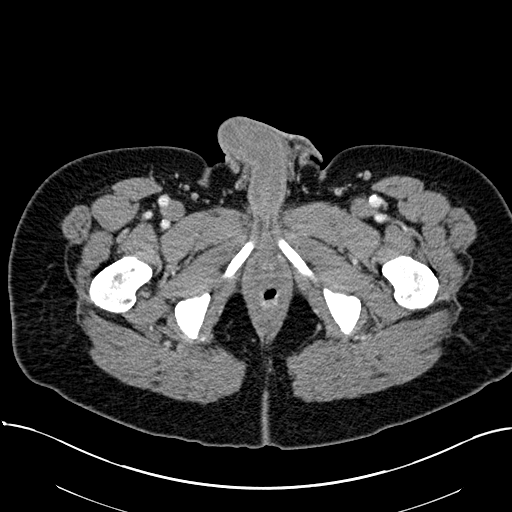
[im 14/202  bone]
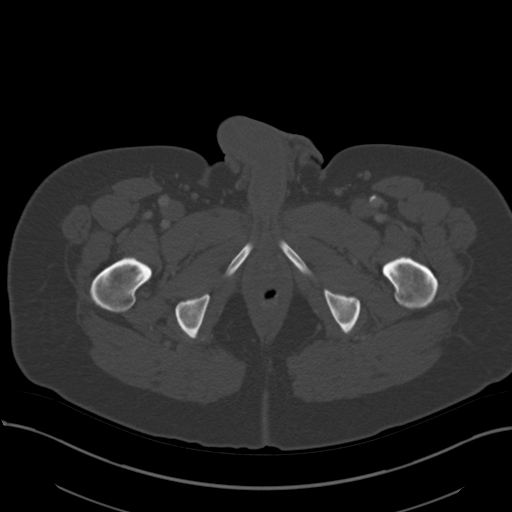
[im 41/202  mediastinal]
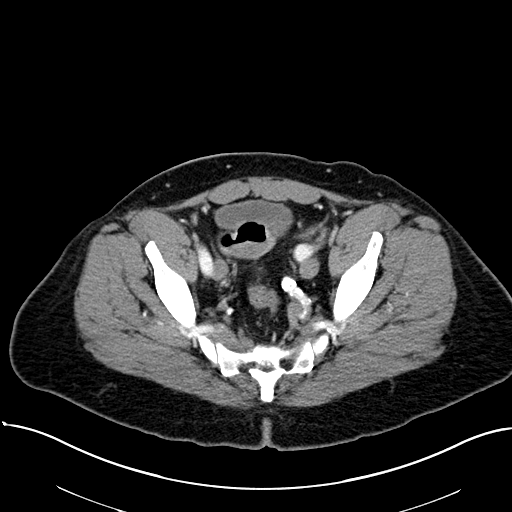
[im 54/202  mediastinal]
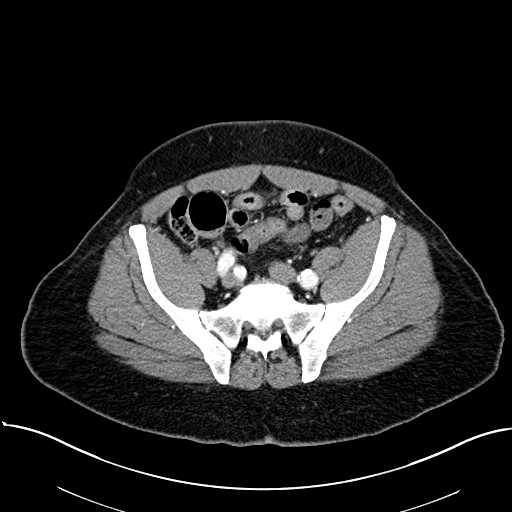
[im 81/202  mediastinal]
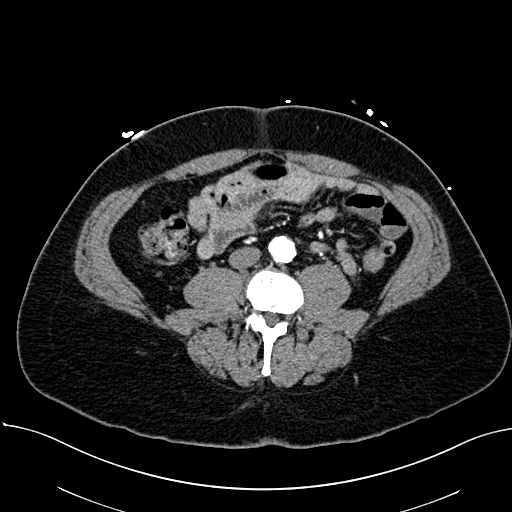
[im 108/202  mediastinal]
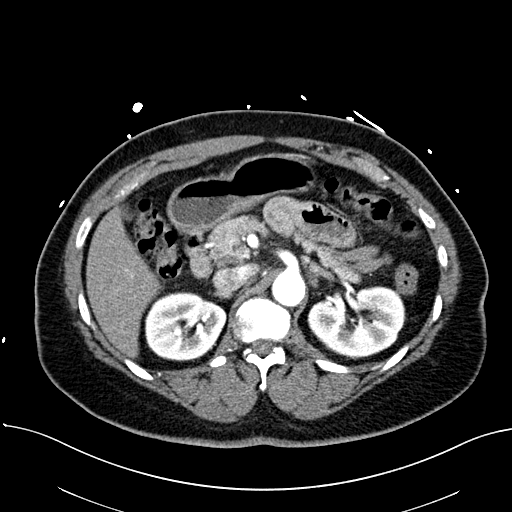
[im 121/202  mediastinal]
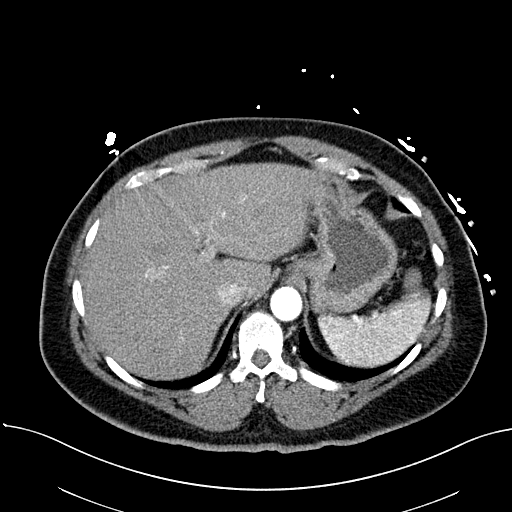
[im 148/202  mediastinal]
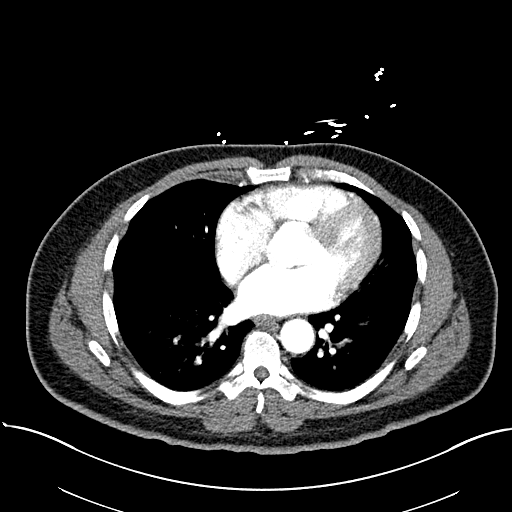
[im 148/202  lung]
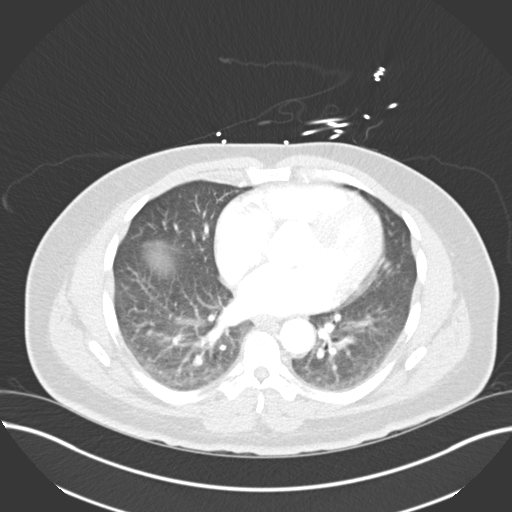
[im 161/202  mediastinal]
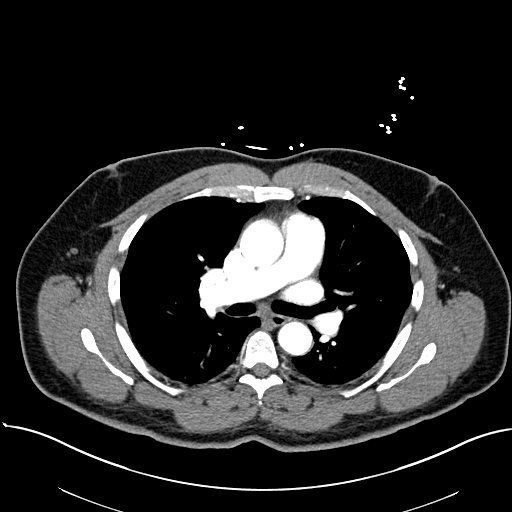
[im 161/202  lung]
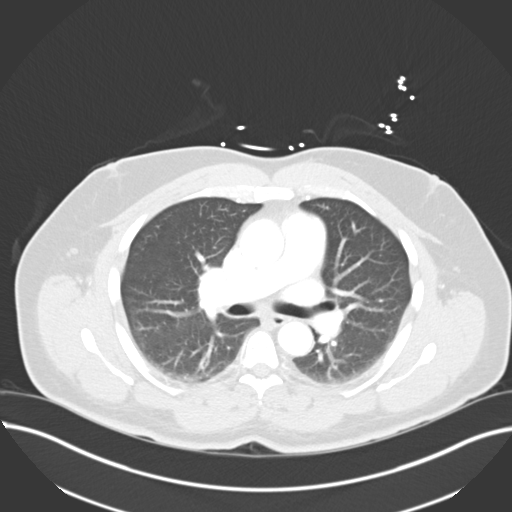
[im 175/202  lung]
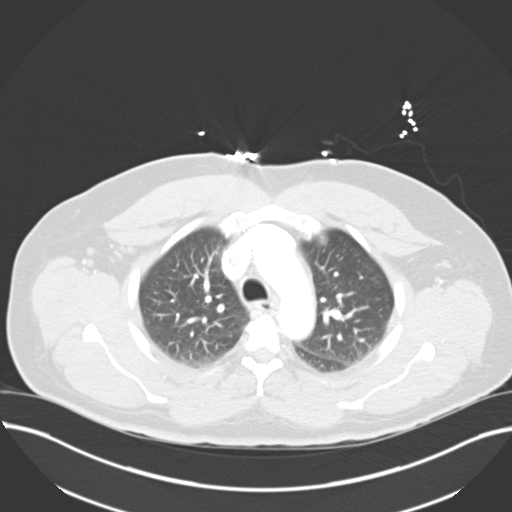
[im 188/202  mediastinal]
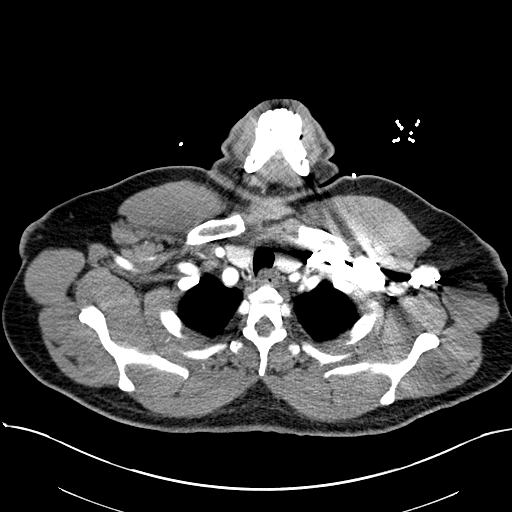
[im 188/202  lung]
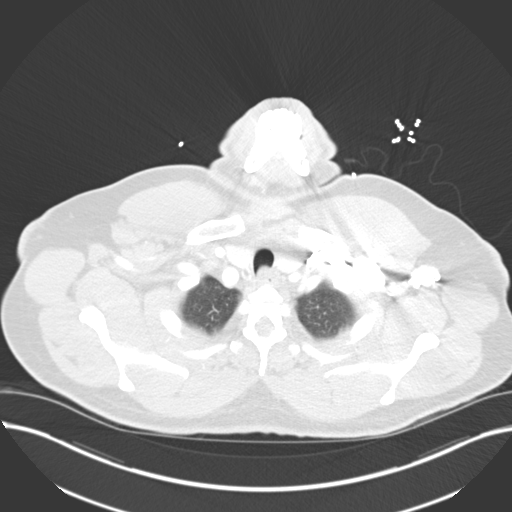
[im 188/202  bone]
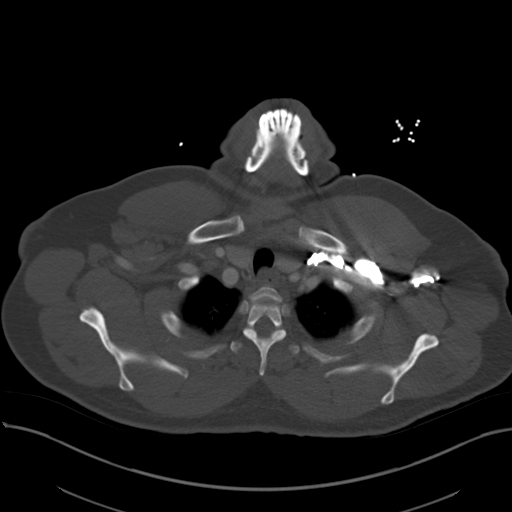

[Series 7: coronal mpr · coronal · 0.88mm/px · 1 of 137 slices shown, 2 images]
[im 69/137  mediastinal]
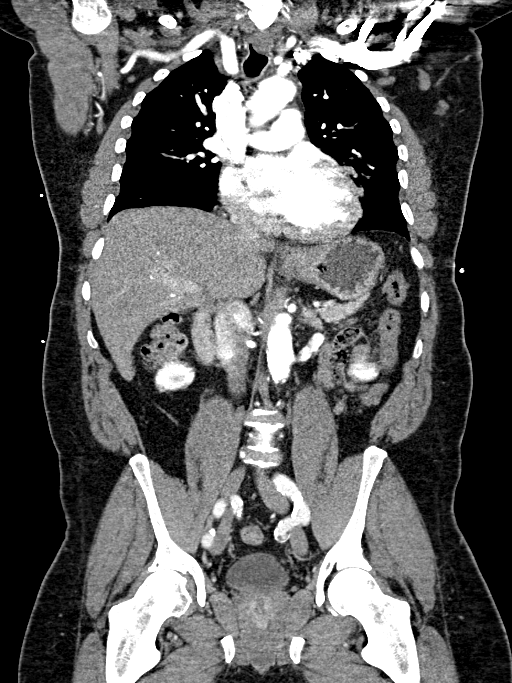
[im 69/137  bone]
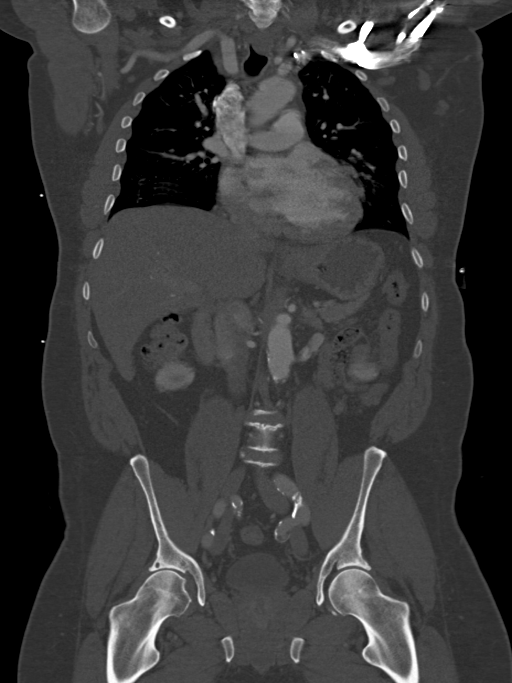

[11 of 36 positions shown; findings below may reference images not displayed]

FINDINGS: CTA CHEST FINDINGS

Cardiovascular: Mild cardiomegaly noted. Coronary artery
calcifications are identified. There is no evidence of thoracic
aortic aneurysm or dissection. No pericardial effusion.

Mediastinum/Nodes: No enlarged mediastinal, hilar, or axillary lymph
nodes. Thyroid gland, trachea, and esophagus demonstrate no
significant findings.

Lungs/Pleura: Mild dependent and bibasilar atelectasis noted. There
is no evidence of airspace disease, consolidation, nodule, mass,
pleural effusion or pneumothorax.

Musculoskeletal: No chest wall abnormality. No acute or significant
osseous findings.

Review of the MIP images confirms the above findings.

CTA ABDOMEN AND PELVIS FINDINGS

VASCULAR

Atherosclerotic calcifications of the abdominal aorta, iliac and
femoral arteries noted. There is no evidence of aneurysm or
dissection. The celiac artery, SMA and IMA are patent.

Review of the MIP images confirms the above findings.

NON-VASCULAR

Hepatobiliary: The liver and gallbladder are unremarkable. No
biliary dilatation.

Pancreas: Unremarkable

Spleen: Unremarkable

Adrenals/Urinary Tract: The kidneys, adrenal glands and bladder are
unremarkable.

Stomach/Bowel: Stomach is within normal limits. Appendix appears
normal. No evidence of bowel wall thickening, distention, or
inflammatory changes.

Lymphatic: No abnormal lymph nodes identified.

Reproductive: Mild prostate enlargement.

Other: No ascites, focal collection or pneumoperitoneum.

Musculoskeletal: No acute abnormality or suspicious focal bony
lesions. Mild degenerative disc disease in the lumbar spine noted.

Review of the MIP images confirms the above findings.
IMPRESSION: 1. No evidence of aortic aneurysm or dissection.
2. No evidence of acute abnormality within the abdomen or pelvis.
3. Mild dependent and bibasilar atelectasis.
4. Mild cardiomegaly and coronary artery disease.
5.  Aortic Atherosclerosis ([id number]).

## 2019-01-30 IMAGING — DX DG CHEST 1V PORT
1 series · 1 of 1 positions shown · non-contrast
Comparison: CT 07/24/2017, radiograph 04/10/2012

CLINICAL DATA: Chest pain

EXAM:
PORTABLE CHEST 1 VIEW

[chest ap]
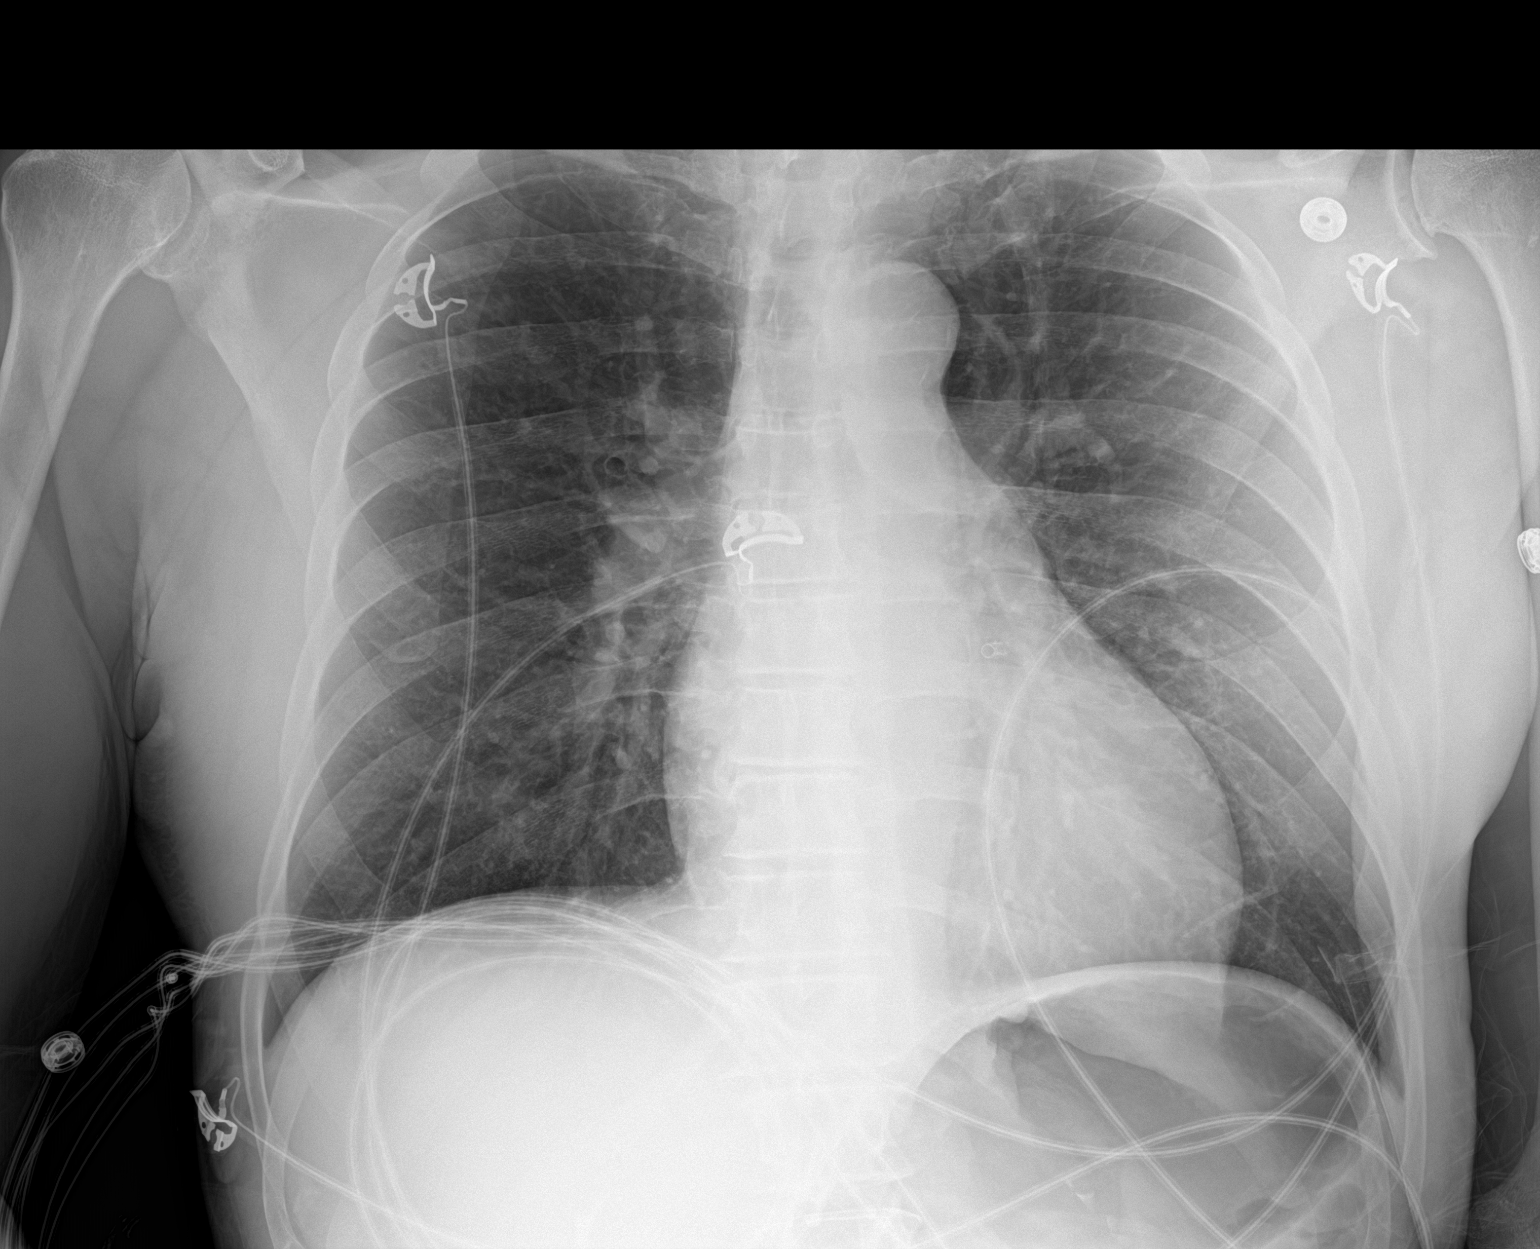

[1 of 1 positions shown; findings below may reference images not displayed]

FINDINGS: Borderline to mild cardiomegaly. No consolidation or effusion.
Aortic atherosclerosis. No pneumothorax. Surgical clips at the left
neck.
IMPRESSION: No active disease.  Borderline cardiomegaly.
